# Patient Record
Sex: Male | Born: 1970 | Race: Black or African American | Hispanic: No | Marital: Single | Smoking: Current every day smoker
Health system: Southern US, Community
[De-identification: ages and names within clinical notes are randomized; demographics above are authoritative.]

## PROBLEM LIST (undated history)

## (undated) DIAGNOSIS — I10 Essential (primary) hypertension: Secondary | ICD-10-CM

---

## 2006-03-06 ENCOUNTER — Inpatient Hospital Stay (HOSPITAL_COMMUNITY): Admission: RE | Admit: 2006-03-06 | Discharge: 2006-03-11 | Payer: Self-pay | Admitting: Psychiatry

## 2006-03-06 ENCOUNTER — Ambulatory Visit: Payer: Self-pay | Admitting: Psychiatry

## 2006-07-11 ENCOUNTER — Emergency Department (HOSPITAL_COMMUNITY): Admission: EM | Admit: 2006-07-11 | Discharge: 2006-07-11 | Payer: Self-pay | Admitting: Emergency Medicine

## 2007-12-06 ENCOUNTER — Emergency Department (HOSPITAL_COMMUNITY): Admission: EM | Admit: 2007-12-06 | Discharge: 2007-12-06 | Payer: Self-pay | Admitting: Emergency Medicine

## 2007-12-08 ENCOUNTER — Emergency Department (HOSPITAL_COMMUNITY): Admission: EM | Admit: 2007-12-08 | Discharge: 2007-12-08 | Payer: Self-pay | Admitting: Emergency Medicine

## 2009-11-28 IMAGING — CT CT ABDOMEN W/O CM
1 of 2 series · 15 of 32 positions shown, 19 images · non-contrast
Comparison: Plain films same date

CT ABDOMEN

CLINICAL DATA: LEFT FLANK PAIN

CT ABDOMEN AND PELVIS WITHOUT CONTRAST (CT UROGRAM)
TECHNIQUE: Contiguous axial images of the abdomen and pelvis
without oral or intravenous contrast were obtained.

[Series 2: stone 5.0 b40f · axial · 0.66mm/px · z∈[-394,-30]mm · 15 of 81 slices shown, 19 images]
[im 4/81  soft-tissue]
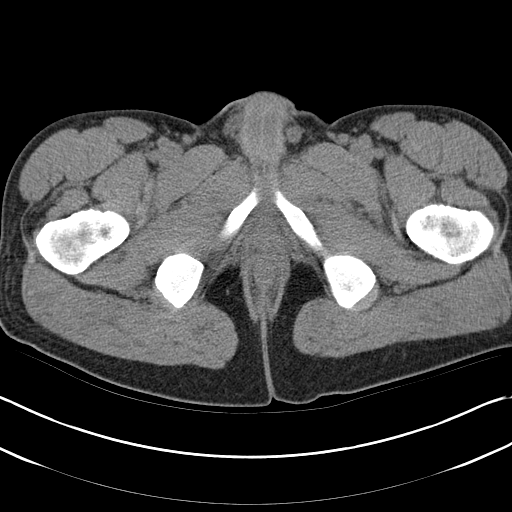
[im 4/81  bone]
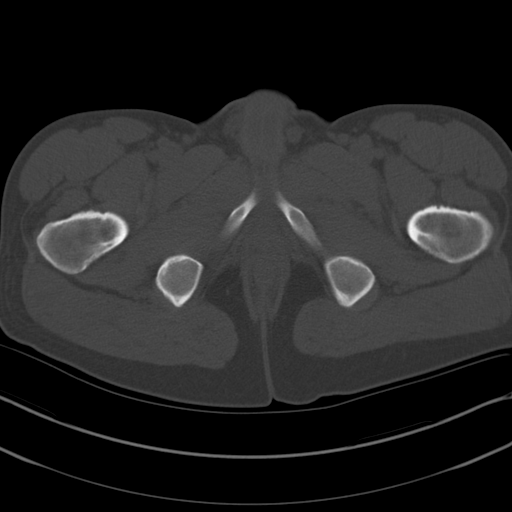
[im 10/81  soft-tissue]
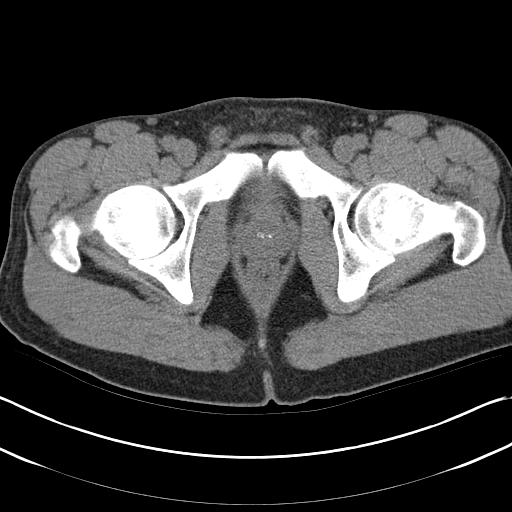
[im 16/81  soft-tissue]
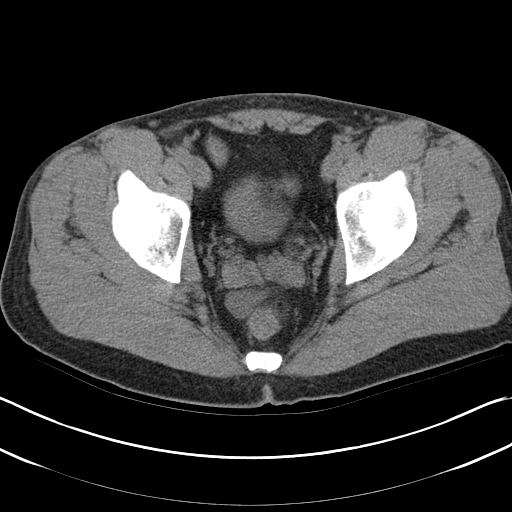
[im 22/81  soft-tissue]
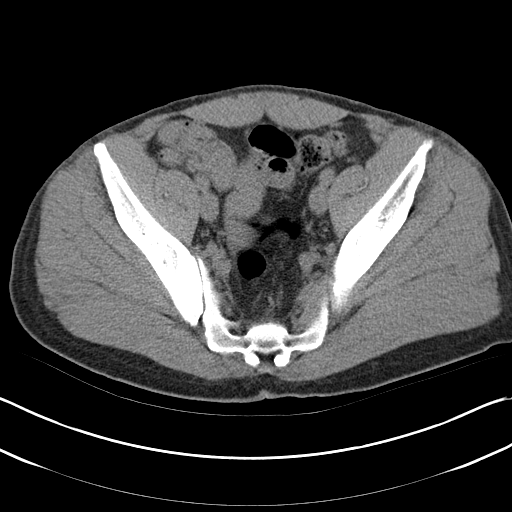
[im 28/81  soft-tissue]
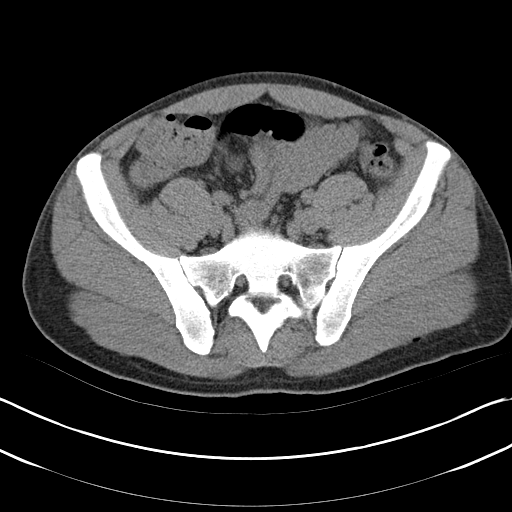
[im 34/81  soft-tissue]
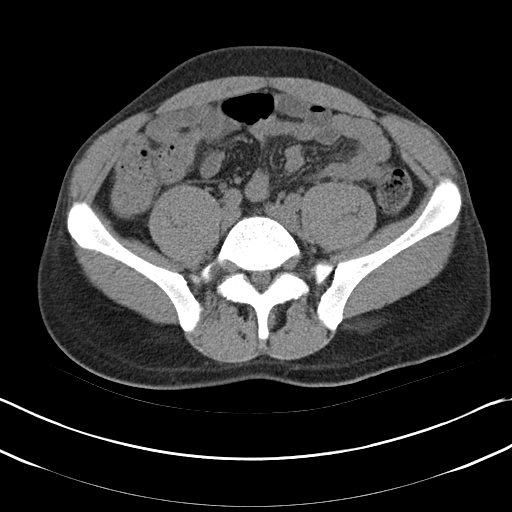
[im 41/81  soft-tissue]
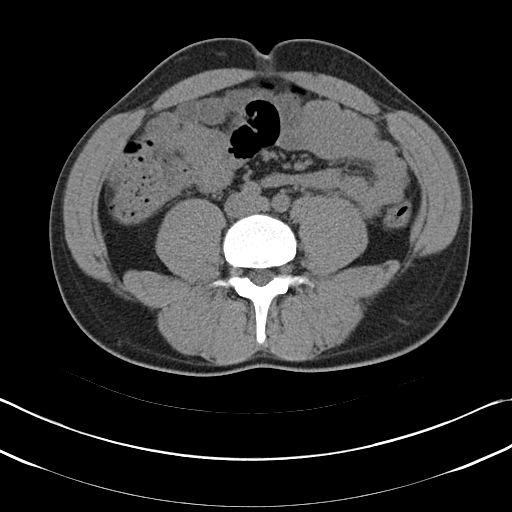
[im 47/81  soft-tissue]
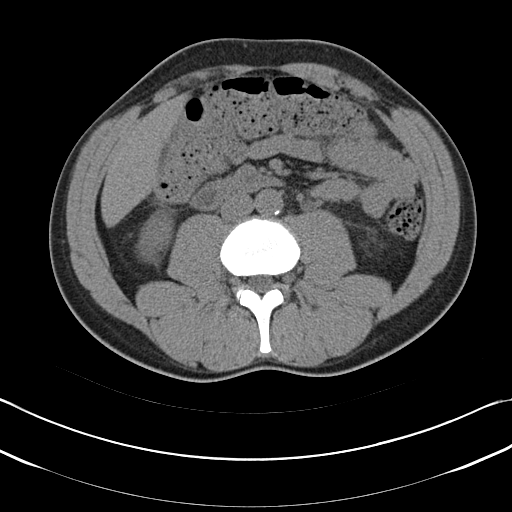
[im 53/81  soft-tissue]
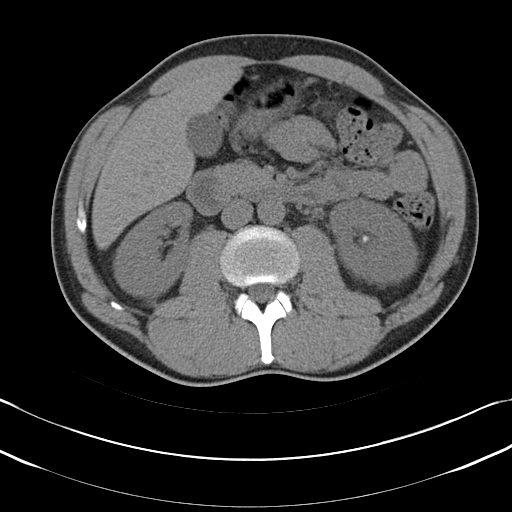
[im 53/81  bone]
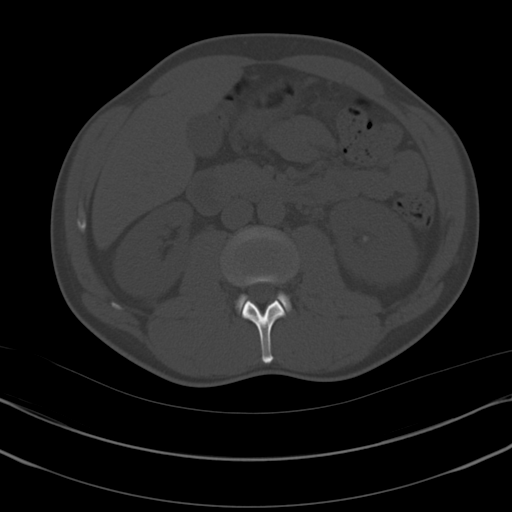
[im 59/81  soft-tissue]
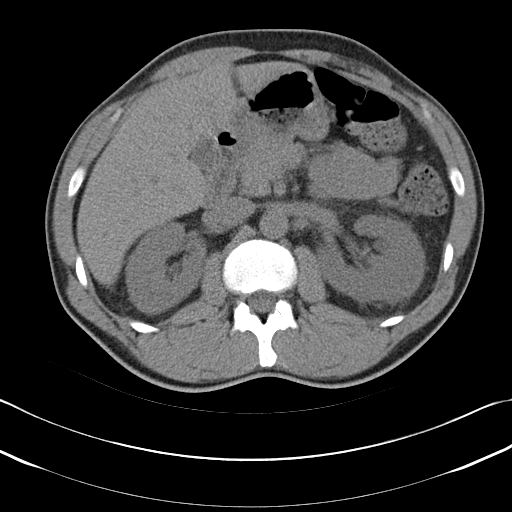
[im 65/81  soft-tissue]
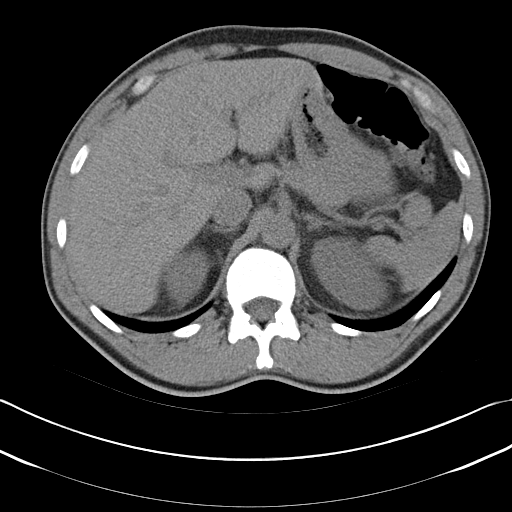
[im 68/81  lung]
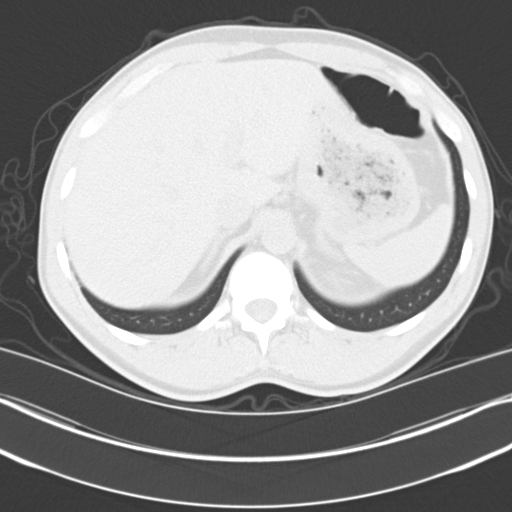
[im 71/81  soft-tissue]
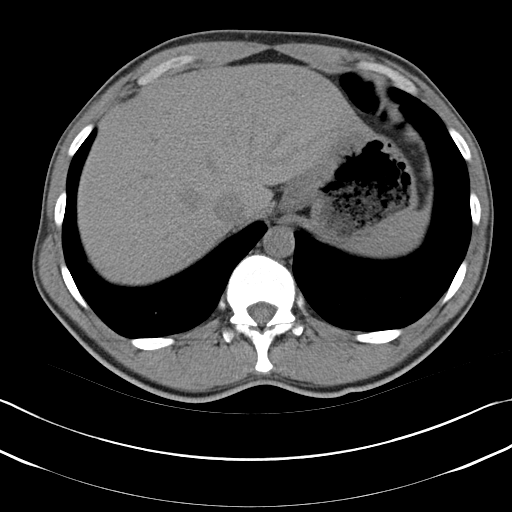
[im 71/81  lung]
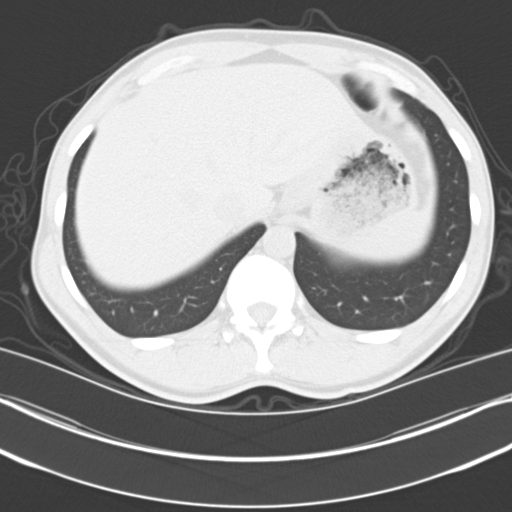
[im 74/81  lung]
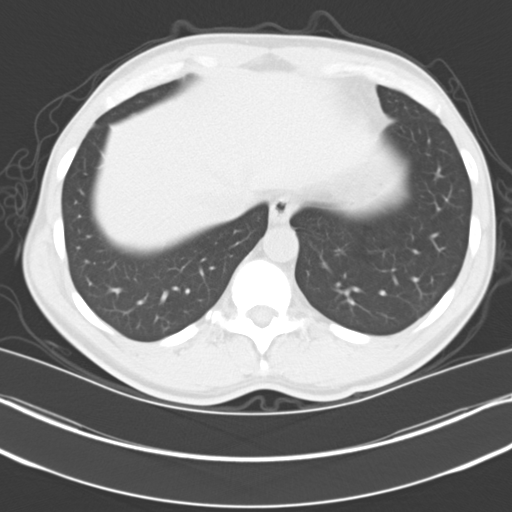
[im 77/81  soft-tissue]
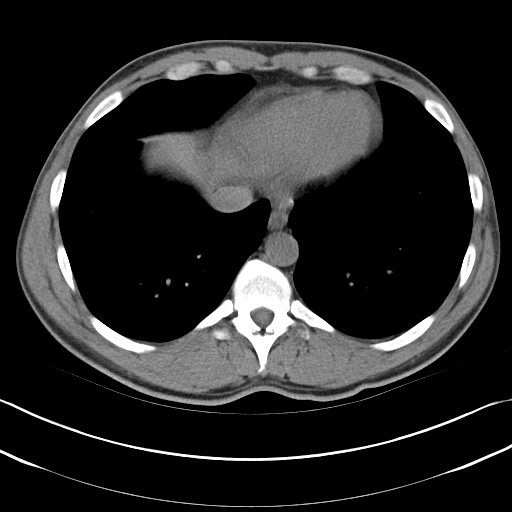
[im 77/81  lung]
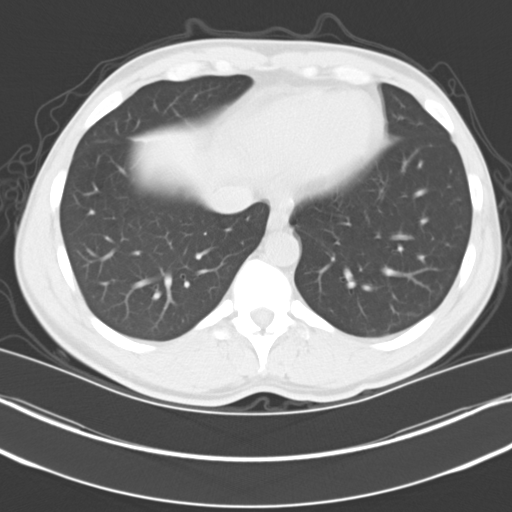

[15 of 32 positions shown; findings below may reference images not displayed]

FINDINGS: Exam is limited for evaluation of entities other than
urinary tract calculi due to lack of oral or intravenous contrast.

 Clear lung bases.  Normal heart size without pericardial or
pleural effusion.

Normal liver, spleen, stomach, pancreas, gallbladder, biliary tree,
adrenal glands.

No right renal calculi or hydronephrosis.

Moderate obstructive signs involve the left kidney.  Punctate left
lower pole renal calculus.  Proximal left ureteric calculus
measures 6 mm on axial image 30 and 6 mm on coronal image 33.  No
distal ureteric stone.

No retroperitoneal or retrocrural adenopathy.  Normal abdominal
bowel loops without ascites.
IMPRESSION: 1.  6 mm proximal left ureteric calculus with secondary obstructive
signs.
2.  Punctate left lower pole renal calculus.

CT PELVIS
FINDINGS: No distal urinary tract calculi.  Normal pelvic small
bowel.  No pelvic adenopathy.  Normal urinary bladder prostate.
Normal bones.
IMPRESSION: 1.  No acute pelvic process.

## 2009-12-30 ENCOUNTER — Emergency Department (HOSPITAL_COMMUNITY): Admission: EM | Admit: 2009-12-30 | Discharge: 2009-12-30 | Payer: Self-pay | Admitting: Emergency Medicine

## 2009-12-31 ENCOUNTER — Emergency Department (HOSPITAL_COMMUNITY): Admission: EM | Admit: 2009-12-31 | Discharge: 2009-12-31 | Payer: Self-pay | Admitting: Emergency Medicine

## 2010-12-21 NOTE — Discharge Summary (Signed)
[name], [name].:  [id number]   MEDICAL RECORD NO.:  [id number]          PATIENT TYPE:  IPS   LOCATION:  0303                          FACILITY:  [facility]   PHYSICIAN:  Anselm Jungling, MD  DATE OF BIRTH:  1971-01-06   DATE OF ADMISSION:  03/06/2006  DATE OF DISCHARGE:  03/11/2006                                 DISCHARGE SUMMARY   This is Dr. Electa Sniff with a psychiatric discharge note on August 9 the  patient is Melvin Hernandez dot admitted 08/02.   DATE OF DISCHARGE:  Seven record number [id number].   IDENTIFYING DATA AND REASON FOR ADMISSION:  The patient is a 40 year old  separated African-American male admitted due to depression, and complaints  of suicidal and homicidal ideation.  He had also been drinking alcohol to  passing out most nights.  He had no history of prior inpatient psychiatric  treatment and was not currently involved in any outpatient treatment.  Please refer to the admission note for further details pertaining to the  symptoms, circumstances, and history that led to his hospitalization.  He  was given initial Axis I diagnoses of depressive disorder NOS, and alcohol  dependence.   MEDICAL AND LABORATORY:  The patient was medically and physically assessed  by the psychiatric nurse practitioner.  He was in good health, without any  active or chronic medical problems.   HOSPITAL COURSE:  The patient was admitted to the adult inpatient  psychiatric service.  He was placed on a Librium protocol for alcohol  withdrawal.  He agreed to a trial of Lexapro 10 mg daily to address  depressive symptoms.  He participated in various therapeutic groups,  activities, and classes, and was a generally good participant.  He was calm,  cooperative, and polite with peers and staff alike throughout his stay.  He  indicated that he was open to the chemical dependency intensive outpatient  treatment program.   By the fourth hospital day, the patient indicated that  he was still having  occasional thoughts of suicide, and homicide towards the gentleman who had  become involved with his fiancee, from whom he was separated.  However, he  denied any actual plan or intent.  He reported that he was finding the  inpatient program helpful.  He was tolerating medication.   On the sixth hospital day, the patient was in much better spirits.  He  indicated that he was having no thoughts of self-harm or harm to others  whatsoever, and felt ready for discharge.   AFTERCARE:  The patient was discharged with a plan to continue on our  intensive outpatient program, to begin the day following his discharge.   DISCHARGE MEDICATIONS:  Lexapro 10 mg daily.   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  Alcohol dependence.   AXIS II:  Deferred.   AXIS III:  No acute or chronic illnesses.   AXIS IV:  Stressors severe.   AXIS V:  Global assessment of function on discharge 60.  Anselm Jungling, MD  Electronically Signed     SPB/MEDQ  D:  03/13/2006  T:  03/13/2006  Job:  [id number]

## 2013-04-18 ENCOUNTER — Encounter (HOSPITAL_COMMUNITY): Payer: Self-pay | Admitting: *Deleted

## 2013-04-18 ENCOUNTER — Emergency Department (HOSPITAL_COMMUNITY)
Admission: EM | Admit: 2013-04-18 | Discharge: 2013-04-18 | Disposition: A | Discharge status: Home / Self Care | Payer: Self-pay | Attending: Emergency Medicine | Admitting: Emergency Medicine

## 2013-04-18 DIAGNOSIS — I10 Essential (primary) hypertension: Secondary | ICD-10-CM | POA: Insufficient documentation

## 2013-04-18 DIAGNOSIS — R319 Hematuria, unspecified: Secondary | ICD-10-CM | POA: Insufficient documentation

## 2013-04-18 DIAGNOSIS — F17200 Nicotine dependence, unspecified, uncomplicated: Secondary | ICD-10-CM | POA: Insufficient documentation

## 2013-04-18 HISTORY — DX: Essential (primary) hypertension: I10

## 2013-04-18 LAB — URINE MICROSCOPIC-ADD ON

## 2013-04-18 LAB — CBC WITH DIFFERENTIAL/PLATELET
Basophils Absolute: 0.1 10*3/uL (ref 0.0–0.1)
Eosinophils Absolute: 0.3 10*3/uL (ref 0.0–0.7)
Eosinophils Relative: 2 % (ref 0–5)
HCT: 45.2 % (ref 39.0–52.0)
MCH: 27.9 pg (ref 26.0–34.0)
MCV: 81.9 fL (ref 78.0–100.0)
Platelets: 298 10*3/uL (ref 150–400)
RDW: 13.8 % (ref 11.5–15.5)

## 2013-04-18 LAB — URINALYSIS, ROUTINE W REFLEX MICROSCOPIC
Glucose, UA: NEGATIVE mg/dL
Ketones, ur: NEGATIVE mg/dL
Leukocytes, UA: NEGATIVE
Protein, ur: NEGATIVE mg/dL
Urobilinogen, UA: 0.2 mg/dL (ref 0.0–1.0)

## 2013-04-18 LAB — BASIC METABOLIC PANEL
CO2: 29 mEq/L (ref 19–32)
Chloride: 100 mEq/L (ref 96–112)
Creatinine, Ser: 1.22 mg/dL (ref 0.50–1.35)

## 2013-04-18 NOTE — ED Notes (Signed)
Pt reporting voiding bloody urine twice since midnight.  Denies any pain at present time.

## 2013-04-18 NOTE — ED Provider Notes (Signed)
CSN: [id number]     Arrival date & time 04/18/13  0502 History   First MD Initiated Contact with Patient 04/18/13 [phone]     Chief Complaint  Patient presents with  . Hematuria   (Consider location/radiation/quality/duration/timing/severity/associated sxs/prior Treatment) Patient is a 42 y.o. male presenting with hematuria. The history is provided by the patient.  Hematuria  He urinated twice during the night and both times there was blood in the urine. He states the urine looked dark brown. There is no associated abdominal pain or flank pain. There is no associated nausea or vomiting or fever or chills. He denies dysuria. He does have history of kidney stone in the past. He denies any trauma.  Past Medical History  Diagnosis Date  . Hypertension    History reviewed. No pertinent past surgical history. History reviewed. No pertinent family history. History  Substance Use Topics  . Smoking status: Current Every Day Smoker -- 1.00 packs/day    Types: Cigarettes  . Smokeless tobacco: Not on file  . Alcohol Use: No    Review of Systems  Genitourinary: Positive for hematuria.  All other systems reviewed and are negative.    Allergies  Review of patient's allergies indicates no known allergies.  Home Medications  No current outpatient prescriptions on file. BP 185/93  Pulse 73  Temp(Src) 98.4 F (36.9 C) (Oral)  Resp 20  Ht 5\' 9"  (1.753 m)  Wt 165 lb (74.844 kg)  BMI 24.36 kg/m2  SpO2 98% Physical Exam  Nursing note and vitals reviewed.  42 year old male, resting comfortably and in no acute distress. Vital signs are significant for hypertension with blood pressure 185/93. Oxygen saturation is 98%, which is normal. Head is normocephalic and atraumatic. PERRLA, EOMI. Oropharynx is clear. Neck is nontender and supple without adenopathy or JVD. Back is nontender and there is no CVA tenderness. Lungs are clear without rales, wheezes, or rhonchi. Chest is nontender. Heart  has regular rate and rhythm without murmur. Abdomen is soft, flat, nontender without masses or hepatosplenomegaly and peristalsis is normoactive. Extremities have no cyanosis or edema, full range of motion is present. Skin is warm and dry without rash. Neurologic: Mental status is normal, cranial nerves are intact, there are no motor or sensory deficits.  ED Course  Procedures (including critical care time) Labs Review Results for orders placed during the hospital encounter of 04/18/13  URINALYSIS, ROUTINE W REFLEX MICROSCOPIC      Result Value Range   Color, Urine YELLOW  YELLOW   APPearance CLEAR  CLEAR   Specific Gravity, Urine >1.030 (*) 1.005 - 1.030   pH 6.0  5.0 - 8.0   Glucose, UA NEGATIVE  NEGATIVE mg/dL   Hgb urine dipstick LARGE (*) NEGATIVE   Bilirubin Urine NEGATIVE  NEGATIVE   Ketones, ur NEGATIVE  NEGATIVE mg/dL   Protein, ur NEGATIVE  NEGATIVE mg/dL   Urobilinogen, UA 0.2  0.0 - 1.0 mg/dL   Nitrite NEGATIVE  NEGATIVE   Leukocytes, UA NEGATIVE  NEGATIVE  CBC WITH DIFFERENTIAL      Result Value Range   WBC 12.9 (*) 4.0 - 10.5 K/uL   RBC 5.52  4.22 - 5.81 MIL/uL   Hemoglobin 15.4  13.0 - 17.0 g/dL   HCT 47.8  29.5 - 62.1 %   MCV 81.9  78.0 - 100.0 fL   MCH 27.9  26.0 - 34.0 pg   MCHC 34.1  30.0 - 36.0 g/dL   RDW 30.8  65.7 - 84.6 %  Platelets 298  150 - 400 K/uL   Neutrophils Relative % 60  43 - 77 %   Neutro Abs 7.7  1.7 - 7.7 K/uL   Lymphocytes Relative 28  12 - 46 %   Lymphs Abs 3.6  0.7 - 4.0 K/uL   Monocytes Relative 10  3 - 12 %   Monocytes Absolute 1.3 (*) 0.1 - 1.0 K/uL   Eosinophils Relative 2  0 - 5 %   Eosinophils Absolute 0.3  0.0 - 0.7 K/uL   Basophils Relative 0  0 - 1 %   Basophils Absolute 0.1  0.0 - 0.1 K/uL  BASIC METABOLIC PANEL      Result Value Range   Sodium 137  135 - 145 mEq/L   Potassium 3.8  3.5 - 5.1 mEq/L   Chloride 100  96 - 112 mEq/L   CO2 29  19 - 32 mEq/L   Glucose, Bld 88  70 - 99 mg/dL   BUN 17  6 - 23 mg/dL    Creatinine, Ser 1.61  0.50 - 1.35 mg/dL   Calcium 09.6  8.4 - 04.5 mg/dL   GFR calc non Af Amer 72 (*) >90 mL/min   GFR calc Af Amer 84 (*) >90 mL/min  URINE MICROSCOPIC-ADD ON      Result Value Range   Squamous Epithelial / LPF RARE  RARE   WBC, UA 0-2  <3 WBC/hpf   RBC / HPF 21-50  <3 RBC/hpf   Bacteria, UA RARE  RARE    MDM   1. Hematuria    Painless hematuria. I reviewed his past records and he didn't have a kidney stone in 2009 and there was a residual calculus in the left kidney. However, I do not see an indication for CAT scan. Urinalysis will be obtained to see if he has any evidence of urinary tract infection and he will need referral to urology.  Urinalysis only shows microscopic hematuria and renal function is normal. At this point, no further need T. workup is indicated. He is referred to urology for further evaluation.  Dione Booze, MD 04/18/13 [phone]

## 2015-04-30 ENCOUNTER — Emergency Department (HOSPITAL_COMMUNITY)
Admission: EM | Admit: 2015-04-30 | Discharge: 2015-04-30 | Disposition: A | Discharge status: Home / Self Care | Payer: Self-pay | Attending: Emergency Medicine | Admitting: Emergency Medicine

## 2015-04-30 ENCOUNTER — Encounter (HOSPITAL_COMMUNITY): Payer: Self-pay | Admitting: *Deleted

## 2015-04-30 DIAGNOSIS — K088 Other specified disorders of teeth and supporting structures: Secondary | ICD-10-CM | POA: Insufficient documentation

## 2015-04-30 DIAGNOSIS — I10 Essential (primary) hypertension: Secondary | ICD-10-CM | POA: Insufficient documentation

## 2015-04-30 DIAGNOSIS — Z720 Tobacco use: Secondary | ICD-10-CM | POA: Insufficient documentation

## 2015-04-30 DIAGNOSIS — K0381 Cracked tooth: Secondary | ICD-10-CM | POA: Insufficient documentation

## 2015-04-30 DIAGNOSIS — K0889 Other specified disorders of teeth and supporting structures: Secondary | ICD-10-CM

## 2015-04-30 MED ORDER — HYDROCODONE-ACETAMINOPHEN 5-325 MG PO TABS: 2.0000 | ORAL_TABLET | ORAL | Status: DC | PRN

## 2015-04-30 MED ORDER — CLINDAMYCIN HCL 300 MG PO CAPS: 300.0000 mg | ORAL_CAPSULE | Freq: Four times a day (QID) | ORAL | Status: DC

## 2015-04-30 NOTE — ED Provider Notes (Signed)
CSN: [id number]     Arrival date & time 04/30/15  4098 History   First MD Initiated Contact with Patient 04/30/15 0900     Chief Complaint  Patient presents with  . Facial Swelling     (Consider location/radiation/quality/duration/timing/severity/associated sxs/prior Treatment) Patient is a 44 y.o. male presenting with tooth pain. The history is provided by the patient. No language interpreter was used.  Dental Pain Location:  Upper Upper teeth location:  1/RU 3rd molar Quality:  Aching Severity:  Moderate Onset quality:  Gradual Duration:  2 days Timing:  Constant Progression:  Worsening Chronicity:  New Context: dental caries and enamel fracture   Relieved by:  Nothing Worsened by:  Nothing tried Associated symptoms: gum swelling   Risk factors: periodontal disease   Pt has appointment with his dentist  Past Medical History  Diagnosis Date  . Hypertension    History reviewed. No pertinent past surgical history. No family history on file. Social History  Substance Use Topics  . Smoking status: Current Every Day Smoker -- 1.00 packs/day    Types: Cigarettes  . Smokeless tobacco: None  . Alcohol Use: No    Review of Systems  All other systems reviewed and are negative.     Allergies  Review of patient's allergies indicates no known allergies.  Home Medications   Prior to Admission medications   Medication Sig Start Date End Date Taking? Authorizing Provider  clindamycin (CLEOCIN) 300 MG capsule Take 1 capsule (300 mg total) by mouth every 6 (six) hours. 04/30/15   Elson Areas, PA-C  HYDROcodone-acetaminophen (NORCO/VICODIN) 5-325 MG per tablet Take 2 tablets by mouth every 4 (four) hours as needed. 04/30/15   Elson Areas, PA-C   BP 167/101 mmHg  Pulse 71  Temp(Src) 98.3 F (36.8 C) (Oral)  Resp 16  Ht  (1.778 m)  Wt 167 lb (75.751 kg)  BMI 23.96 kg/m2  SpO2 100% Physical Exam  Constitutional: He is oriented to person, place, and time. He  appears well-developed and well-nourished.  HENT:  Head: Normocephalic.  Broken tooth, swollen face  Eyes: Conjunctivae and EOM are normal. Pupils are equal, round, and reactive to light.  Neck: Normal range of motion.  Cardiovascular: Normal rate.   Pulmonary/Chest: Effort normal.  Abdominal: He exhibits no distension.  Musculoskeletal: Normal range of motion.  Neurological: He is alert and oriented to person, place, and time.  Skin: Skin is warm.  Psychiatric: He has a normal mood and affect.  Nursing note and vitals reviewed.   ED Course  Procedures (including critical care time) Labs Review Labs Reviewed - No data to display  Imaging Review No results found. I have personally reviewed and evaluated these images and lab results as part of my medical decision-making.   EKG Interpretation None      MDM   Final diagnoses:  Toothache    Meds ordered this encounter  Medications  . clindamycin (CLEOCIN) 300 MG capsule    Sig: Take 1 capsule (300 mg total) by mouth every 6 (six) hours.    Dispense:  40 capsule    Refill:  0  . HYDROcodone-acetaminophen (NORCO/VICODIN) 5-325 MG per tablet    Sig: Take 2 tablets by mouth every 4 (four) hours as needed.    Dispense:  10 tablet    Refill:  0     Lonia Skinner [place], PA-C 04/30/15 1191  Glynn Octave, MD 04/30/15 1520

## 2015-04-30 NOTE — Discharge Instructions (Signed)

## 2015-04-30 NOTE — ED Notes (Signed)
Pt states chipped tooth and noticed swelling on Friday which has gotten worse. Pt also states recent sinus infection as well

## 2015-06-09 ENCOUNTER — Other Ambulatory Visit: Payer: Self-pay | Admitting: Physician Assistant

## 2015-08-24 ENCOUNTER — Ambulatory Visit: Payer: Self-pay | Admitting: Physician Assistant

## 2015-08-24 ENCOUNTER — Encounter: Payer: Self-pay | Admitting: Physician Assistant

## 2015-08-24 VITALS — BP 154/96 | HR 66 | Temp 98.6°F | Ht 69.0 in | Wt 176.4 lb

## 2015-08-24 DIAGNOSIS — F17210 Nicotine dependence, cigarettes, uncomplicated: Secondary | ICD-10-CM | POA: Insufficient documentation

## 2015-08-24 DIAGNOSIS — I10 Essential (primary) hypertension: Secondary | ICD-10-CM | POA: Insufficient documentation

## 2015-08-24 DIAGNOSIS — Z13220 Encounter for screening for lipoid disorders: Secondary | ICD-10-CM

## 2015-08-24 DIAGNOSIS — K5900 Constipation, unspecified: Secondary | ICD-10-CM

## 2015-08-24 MED ORDER — LISINOPRIL 20 MG PO TABS: 40.0000 mg | ORAL_TABLET | Freq: Every day | ORAL | Status: DC

## 2015-08-24 NOTE — Progress Notes (Signed)
   BP 154/96 mmHg  Pulse 66  Temp(Src) 98.6 F (37 C)  Ht  (1.753 m)  Wt 176 lb 6.4 oz (80.015 kg)  BMI 26.04 kg/m2  SpO2 98%   Subjective:    Patient ID: Melvin Hernandez, male    DOB: 1971/05/15, 45 y.o.   MRN: [id number]  HPI: Melvin Hernandez is a 45 y.o. male presenting on 08/24/2015 for Hypertension   HPI   Pt is feeling well.  States occassional constipation is only concern.  Relevant past medical, surgical, family and social history reviewed and updated as indicated. Interim medical history since our last visit reviewed. Allergies and medications reviewed and updated.  Current outpatient prescriptions:  .  lisinopril (PRINIVIL,ZESTRIL) 20 MG tablet, TAKE ONE TABLET BY MOUTH ONCE DAILY FOR BLOOD PRESSURE, Disp: 30 tablet, Rfl: 2   Review of Systems  Constitutional: Negative for fever, chills, diaphoresis, appetite change, fatigue and unexpected weight change.  HENT: Negative for congestion, dental problem, drooling, ear pain, facial swelling, hearing loss, mouth sores, sneezing, sore throat, trouble swallowing and voice change.   Eyes: Negative for pain, discharge, redness, itching and visual disturbance.  Respiratory: Negative for cough, choking, shortness of breath and wheezing.   Cardiovascular: Negative for chest pain, palpitations and leg swelling.  Gastrointestinal: Positive for abdominal pain and constipation. Negative for vomiting, diarrhea and blood in stool.  Endocrine: Negative for cold intolerance, heat intolerance and polydipsia.  Genitourinary: Negative for dysuria, hematuria and decreased urine volume.  Musculoskeletal: Negative for back pain, arthralgias and gait problem.  Skin: Negative for rash.  Allergic/Immunologic: Negative for environmental allergies.  Neurological: Negative for seizures, syncope, light-headedness and headaches.  Hematological: Negative for adenopathy.  Psychiatric/Behavioral: Negative for suicidal ideas, dysphoric mood and  agitation. The patient is not nervous/anxious.     Per HPI unless specifically indicated above     Objective:    BP 154/96 mmHg  Pulse 66  Temp(Src) 98.6 F (37 C)  Ht  (1.753 m)  Wt 176 lb 6.4 oz (80.015 kg)  BMI 26.04 kg/m2  SpO2 98%  Wt Readings from Last 3 Encounters:  08/24/15 176 lb 6.4 oz (80.015 kg)  04/30/15 167 lb (75.751 kg)  04/18/13 165 lb (74.844 kg)    Physical Exam  Constitutional: He is oriented to person, place, and time. He appears well-developed and well-nourished.  HENT:  Head: Normocephalic and atraumatic.  Neck: Neck supple.  Cardiovascular: Normal rate and regular rhythm.   Pulmonary/Chest: Effort normal and breath sounds normal. He has no wheezes.  Abdominal: Soft. Bowel sounds are normal. He exhibits no distension. There is no hepatosplenomegaly. There is no tenderness.  Musculoskeletal: He exhibits no edema.  Lymphadenopathy:    He has no cervical adenopathy.  Neurological: He is alert and oriented to person, place, and time.  Skin: Skin is warm and dry.  Psychiatric: He has a normal mood and affect. His behavior is normal.  Vitals reviewed.       Assessment & Plan:   Encounter Diagnoses  Name Primary?  . Essential hypertension, benign Yes  . Constipation, unspecified constipation type   . Cigarette nicotine dependence, uncomplicated      Increase lisinopril Counseled on smoking cessation Counseled on constipation and gave reading information F/u 1 mo to recheck bp and constipation

## 2015-08-24 NOTE — Patient Instructions (Signed)
Constipation, Adult Constipation is when a person has fewer than three bowel movements a week, has difficulty having a bowel movement, or has stools that are dry, hard, or larger than normal. As people grow older, constipation is more common. A low-fiber diet, not taking in enough fluids, and taking certain medicines may make constipation worse.  CAUSES   Certain medicines, such as antidepressants, pain medicine, iron supplements, antacids, and water pills.   Certain diseases, such as diabetes, irritable bowel syndrome (IBS), thyroid disease, or depression.   Not drinking enough water.   Not eating enough fiber-rich foods.   Stress or travel.   Lack of physical activity or exercise.   Ignoring the urge to have a bowel movement.   Using laxatives too much.  SIGNS AND SYMPTOMS   Having fewer than three bowel movements a week.   Straining to have a bowel movement.   Having stools that are hard, dry, or larger than normal.   Feeling full or bloated.   Pain in the lower abdomen.   Not feeling relief after having a bowel movement.  TREATMENT  Treatment will depend on the severity of your constipation and what is causing it. Some dietary treatments include drinking more fluids and eating more fiber-rich foods. Lifestyle treatments may include regular exercise. If these diet and lifestyle recommendations do not help, your health care provider may recommend taking over-the-counter laxative medicines to help you have bowel movements. Prescription medicines may be prescribed if over-the-counter medicines do not work.  HOME CARE INSTRUCTIONS   Eat foods that have a lot of fiber, such as fruits, vegetables, whole grains, and beans.  Limit foods high in fat and processed sugars, such as french fries, hamburgers, cookies, candies, and soda.   A fiber supplement may be added to your diet if you cannot get enough fiber from foods.   Drink enough fluids to keep your urine  clear or pale yellow.   Exercise regularly or as directed by your health care provider.   Go to the restroom when you have the urge to go. Do not hold it.   Only take over-the-counter or prescription medicines as directed by your health care provider. Do not take other medicines for constipation without talking to your health care provider first.  SEEK IMMEDIATE MEDICAL CARE IF:   You have bright red blood in your stool.   Your constipation lasts for more than 4 days or gets worse.   You have abdominal or rectal pain.   You have thin, pencil-like stools.   You have unexplained weight loss. MAKE SURE YOU:   Understand these instructions.  Will watch your condition.  Will get help right away if you are not doing well or get worse.   This information is not intended to replace advice given to you by your health care provider. Make sure you discuss any questions you have with your health care provider.   Document Released: 04/19/2004 Document Revised: 08/12/2014 Document Reviewed: 05/03/2013 [employer] Interactive Patient Education 2016 [employer]  

## 2015-09-19 ENCOUNTER — Other Ambulatory Visit: Payer: Self-pay

## 2015-09-19 DIAGNOSIS — I10 Essential (primary) hypertension: Secondary | ICD-10-CM

## 2015-09-19 DIAGNOSIS — Z13220 Encounter for screening for lipoid disorders: Secondary | ICD-10-CM

## 2015-09-19 DIAGNOSIS — K5900 Constipation, unspecified: Secondary | ICD-10-CM

## 2015-09-23 LAB — CBC
HCT: 46.2 % (ref 39.0–52.0)
Hemoglobin: 15.3 g/dL (ref 13.0–17.0)
MCH: 27 pg (ref 26.0–34.0)
MCHC: 33.1 g/dL (ref 30.0–36.0)
MCV: 81.6 fL (ref 78.0–100.0)
MPV: 10.1 fL (ref 8.6–12.4)
PLATELETS: 264 10*3/uL (ref 150–400)
RBC: 5.66 MIL/uL (ref 4.22–5.81)
RDW: 13.7 % (ref 11.5–15.5)
WBC: 13.8 10*3/uL — ABNORMAL HIGH (ref 4.0–10.5)

## 2015-09-23 LAB — COMPLETE METABOLIC PANEL WITH GFR
ALBUMIN: 4.2 g/dL (ref 3.6–5.1)
ALK PHOS: 65 U/L (ref 40–115)
ALT: 15 U/L (ref 9–46)
AST: 15 U/L (ref 10–40)
BILIRUBIN TOTAL: 0.6 mg/dL (ref 0.2–1.2)
BUN: 16 mg/dL (ref 7–25)
CALCIUM: 9.5 mg/dL (ref 8.6–10.3)
CO2: 26 mmol/L (ref 20–31)
Chloride: 102 mmol/L (ref 98–110)
Creat: 1.34 mg/dL (ref 0.60–1.35)
GFR, EST NON AFRICAN AMERICAN: 64 mL/min (ref >=60)
GFR, Est African American: 74 mL/min (ref >=60)
GLUCOSE: 85 mg/dL (ref 65–99)
Potassium: 5 mmol/L (ref 3.5–5.3)
SODIUM: 137 mmol/L (ref 135–146)
Total Protein: 6.3 g/dL (ref 6.1–8.1)

## 2015-09-23 LAB — LIPID PANEL
CHOL/HDL RATIO: 2.4 ratio (ref <=5.0)
Cholesterol: 163 mg/dL (ref 125–200)
HDL: 68 mg/dL (ref >=40)
LDL Cholesterol: 83 mg/dL (ref <130)
Triglycerides: 58 mg/dL (ref <150)
VLDL: 12 mg/dL (ref <30)

## 2015-09-26 ENCOUNTER — Ambulatory Visit: Payer: Self-pay | Admitting: Physician Assistant

## 2015-09-27 ENCOUNTER — Encounter: Payer: Self-pay | Admitting: Physician Assistant

## 2015-10-31 ENCOUNTER — Ambulatory Visit: Payer: Self-pay | Admitting: Physician Assistant

## 2015-10-31 ENCOUNTER — Encounter: Payer: Self-pay | Admitting: Physician Assistant

## 2015-10-31 VITALS — BP 164/102 | HR 76 | Temp 97.9°F | Ht 69.0 in | Wt 176.1 lb

## 2015-10-31 DIAGNOSIS — Z91199 Patient's noncompliance with other medical treatment and regimen due to unspecified reason: Secondary | ICD-10-CM

## 2015-10-31 DIAGNOSIS — Z9119 Patient's noncompliance with other medical treatment and regimen: Secondary | ICD-10-CM

## 2015-10-31 DIAGNOSIS — F17210 Nicotine dependence, cigarettes, uncomplicated: Secondary | ICD-10-CM

## 2015-10-31 DIAGNOSIS — I10 Essential (primary) hypertension: Secondary | ICD-10-CM

## 2015-10-31 NOTE — Progress Notes (Signed)
BP 164/102 mmHg  Pulse 76  Temp(Src) 97.9 F (36.6 C)  Ht  (1.753 m)  Wt 176 lb 1.6 oz (79.878 kg)  BMI 25.99 kg/m2  SpO2 98%   Subjective:    Patient ID: Melvin Hernandez, male    DOB: 01/09/1971, 45 y.o.   MRN: [id number]  HPI: Melvin Hernandez is a 45 y.o. male presenting on 10/31/2015 for Hypertension   HPI   Pt has been out of his bp meds for several days.  Feels well.  Relevant past medical, surgical, family and social history reviewed and updated as indicated. Interim medical history since our last visit reviewed. Allergies and medications reviewed and updated.  CURRENT MEDS: None (supposed to be taking lisinopril  qd)  Review of Systems  Constitutional: Negative for fever, chills, diaphoresis, appetite change, fatigue and unexpected weight change.  HENT: Positive for sneezing. Negative for congestion, dental problem, drooling, ear pain, facial swelling, hearing loss, mouth sores, sore throat, trouble swallowing and voice change.   Eyes: Negative for pain, discharge, redness, itching and visual disturbance.  Respiratory: Negative for cough, choking, shortness of breath and wheezing.   Cardiovascular: Negative for chest pain, palpitations and leg swelling.  Gastrointestinal: Negative for vomiting, abdominal pain, diarrhea, constipation and blood in stool.  Endocrine: Negative for cold intolerance, heat intolerance and polydipsia.  Genitourinary: Negative for dysuria, hematuria and decreased urine volume.  Musculoskeletal: Negative for back pain, arthralgias and gait problem.  Skin: Negative for rash.  Allergic/Immunologic: Positive for environmental allergies.  Neurological: Negative for seizures, syncope, light-headedness and headaches.  Hematological: Negative for adenopathy.  Psychiatric/Behavioral: Negative for suicidal ideas, dysphoric mood and agitation. The patient is not nervous/anxious.     Per HPI unless specifically indicated above     Objective:    BP 164/102 mmHg  Pulse 76  Temp(Src) 97.9 F (36.6 C)  Ht  (1.753 m)  Wt 176 lb 1.6 oz (79.878 kg)  BMI 25.99 kg/m2  SpO2 98%  Wt Readings from Last 3 Encounters:  10/31/15 176 lb 1.6 oz (79.878 kg)  08/24/15 176 lb 6.4 oz (80.015 kg)  04/30/15 167 lb (75.751 kg)    Physical Exam  Constitutional: He is oriented to person, place, and time. He appears well-developed and well-nourished.  HENT:  Head: Normocephalic and atraumatic.  Neck: Neck supple.  Cardiovascular: Normal rate and regular rhythm.   Pulmonary/Chest: Effort normal and breath sounds normal. He has no wheezes.  Abdominal: Soft. Bowel sounds are normal. There is no hepatosplenomegaly. There is no tenderness.  Musculoskeletal: He exhibits no edema.  Lymphadenopathy:    He has no cervical adenopathy.  Neurological: He is alert and oriented to person, place, and time.  Skin: Skin is warm and dry.  Psychiatric: He has a normal mood and affect. His behavior is normal.  Vitals reviewed.   Results for orders placed or performed in visit on 09/19/15  Lipid Profile  Result Value Ref Range   Cholesterol 163 125 - 200 mg/dL   Triglycerides 58 <696 mg/dL   HDL 68 >=29 mg/dL   Total CHOL/HDL Ratio 2.4 <=5.0 Ratio   VLDL 12 <30 mg/dL   LDL Cholesterol 83 <528 mg/dL  COMPLETE METABOLIC PANEL WITH GFR  Result Value Ref Range   Sodium 137 135 - 146 mmol/L   Potassium 5.0 3.5 - 5.3 mmol/L   Chloride 102 98 - 110 mmol/L   CO2 26 20 - 31 mmol/L   Glucose, Bld 85 65 - 99  mg/dL   BUN 16 7 - 25 mg/dL   Creat 1.611.34 0.960.60 - 0.451.35 mg/dL   Total Bilirubin 0.6 0.2 - 1.2 mg/dL   Alkaline Phosphatase 65 40 - 115 U/L   AST 15 10 - 40 U/L   ALT 15 9 - 46 U/L   Total Protein 6.3 6.1 - 8.1 g/dL   Albumin 4.2 3.6 - 5.1 g/dL   Calcium 9.5 8.6 - 40.910.3 mg/dL   GFR, Est African American 74 >=60 mL/min   GFR, Est Non African American 64 >=60 mL/min  CBC  Result Value Ref Range   WBC 13.8 (H) 4.0 - 10.5 K/uL   RBC 5.66 4.22 - 5.81  MIL/uL   Hemoglobin 15.3 13.0 - 17.0 g/dL   HCT 81.146.2 91.439.0 - 78.252.0 %   MCV 81.6 78.0 - 100.0 fL   MCH 27.0 26.0 - 34.0 pg   MCHC 33.1 30.0 - 36.0 g/dL   RDW 95.613.7 21.311.5 - 08.615.5 %   Platelets 264 150 - 400 K/uL   MPV 10.1 8.6 - 12.4 fL      Assessment & Plan:   Encounter Diagnoses  Name Primary?  . Personal history of noncompliance with medical treatment, presenting hazards to health Yes  . Essential hypertension, benign   . Cigarette nicotine dependence, uncomplicated     -reviewed labs with pt -Pt counseled to avoid running out of his bp medication.   -pt counseled on smoking cessation -f/u 1 month to recheck bp

## 2015-11-30 ENCOUNTER — Ambulatory Visit: Payer: Self-pay | Admitting: Physician Assistant

## 2015-12-04 ENCOUNTER — Encounter: Payer: Self-pay | Admitting: Physician Assistant

## 2015-12-07 ENCOUNTER — Ambulatory Visit: Payer: Self-pay | Admitting: Physician Assistant

## 2015-12-07 ENCOUNTER — Encounter: Payer: Self-pay | Admitting: Physician Assistant

## 2015-12-07 VITALS — BP 154/92 | HR 79 | Temp 98.1°F | Ht 69.0 in | Wt 172.9 lb

## 2015-12-07 DIAGNOSIS — I10 Essential (primary) hypertension: Secondary | ICD-10-CM

## 2015-12-07 MED ORDER — METOPROLOL TARTRATE 50 MG PO TABS: 50.0000 mg | ORAL_TABLET | Freq: Two times a day (BID) | ORAL | Status: DC

## 2015-12-07 NOTE — Progress Notes (Signed)
   BP 154/92 mmHg  Pulse 79  Temp(Src) 98.1 F (36.7 C)  Ht 5\' 9"  (1.753 m)  Wt 172 lb 14.4 oz (78.427 kg)  BMI 25.52 kg/m2  SpO2 96%   Subjective:    Patient ID: Melvin Hernandez, male    DOB: [birth year], 45 y.o.   MRN: [id number]  HPI: Melvin Hernandez is a 45 y.o. male presenting on 12/07/2015 for Hypertension   HPI Pt no-showed to his appointment last week.   Relevant past medical, surgical, family and social history reviewed and updated as indicated. Interim medical history since our last visit reviewed. Allergies and medications reviewed and updated.  Current outpatient prescriptions:  .  lisinopril (PRINIVIL,ZESTRIL) 20 MG tablet, Take 2 tablets (40 mg total) by mouth daily., Disp: 60 tablet, Rfl: 3   Review of Systems  Constitutional: Positive for appetite change. Negative for fever, chills, diaphoresis, fatigue and unexpected weight change.  HENT: Negative for congestion, dental problem, drooling, ear pain, facial swelling, hearing loss, mouth sores, sneezing, sore throat, trouble swallowing and voice change.   Eyes: Negative for pain, discharge, redness, itching and visual disturbance.  Respiratory: Negative for cough, choking, shortness of breath and wheezing.   Cardiovascular: Negative for chest pain, palpitations and leg swelling.  Gastrointestinal: Negative for vomiting, abdominal pain, diarrhea, constipation and blood in stool.  Endocrine: Negative for cold intolerance, heat intolerance and polydipsia.  Genitourinary: Negative for dysuria, hematuria and decreased urine volume.  Musculoskeletal: Negative for back pain, arthralgias and gait problem.  Skin: Negative for rash.  Allergic/Immunologic: Negative for environmental allergies.  Neurological: Negative for seizures, syncope, light-headedness and headaches.  Hematological: Negative for adenopathy.  Psychiatric/Behavioral: Negative for suicidal ideas, dysphoric mood and agitation. The patient is not nervous/anxious.      Per HPI unless specifically indicated above     Objective:    BP 154/92 mmHg  Pulse 79  Temp(Src) 98.1 F (36.7 C)  Ht 5\' 9"  (1.753 m)  Wt 172 lb 14.4 oz (78.427 kg)  BMI 25.52 kg/m2  SpO2 96%  Wt Readings from Last 3 Encounters:  12/07/15 172 lb 14.4 oz (78.427 kg)  10/31/15 176 lb 1.6 oz (79.878 kg)  08/24/15 176 lb 6.4 oz (80.015 kg)    Physical Exam  Constitutional: He is oriented to person, place, and time. He appears well-developed and well-nourished.  HENT:  Head: Normocephalic and atraumatic.  Neck: Neck supple.  Cardiovascular: Normal rate and regular rhythm.   Pulmonary/Chest: Effort normal and breath sounds normal. He has no wheezes.  Abdominal: Soft. Bowel sounds are normal. There is no hepatosplenomegaly. There is no tenderness.  Musculoskeletal: He exhibits no edema.  Lymphadenopathy:    He has no cervical adenopathy.  Neurological: He is alert and oriented to person, place, and time.  Skin: Skin is warm and dry.  Psychiatric: He has a normal mood and affect. His behavior is normal.  Vitals reviewed.     Assessment & Plan:   Encounter Diagnosis  Name Primary?  . Essential hypertension, benign Yes    -Add metoprolol -F/u 1 month to recheck bp

## 2016-01-08 ENCOUNTER — Ambulatory Visit: Payer: Self-pay | Admitting: Physician Assistant

## 2016-01-08 ENCOUNTER — Encounter: Payer: Self-pay | Admitting: Physician Assistant

## 2016-01-08 VITALS — BP 154/86 | HR 57 | Temp 98.1°F | Ht 69.0 in | Wt 177.4 lb

## 2016-01-08 DIAGNOSIS — I10 Essential (primary) hypertension: Secondary | ICD-10-CM

## 2016-01-08 MED ORDER — METOPROLOL TARTRATE 100 MG PO TABS: 100.0000 mg | ORAL_TABLET | Freq: Two times a day (BID) | ORAL | Status: DC

## 2016-01-08 MED ORDER — LISINOPRIL 20 MG PO TABS: 20.0000 mg | ORAL_TABLET | Freq: Two times a day (BID) | ORAL | Status: DC

## 2016-01-08 NOTE — Progress Notes (Signed)
   BP 154/86 mmHg  Pulse 57  Temp(Src) 98.1 F (36.7 C)  Ht 5\' 9"  (1.753 m)  Wt 177 lb 6.4 oz (80.468 kg)  BMI 26.19 kg/m2  SpO2 97%   Subjective:    Patient ID: Melvin Hernandez, male    DOB: 02-16-1971, 45 y.o.   MRN: [id number]  HPI: Melvin CantorSteven L Ruvalcaba is a 45 y.o. male presenting on 01/08/2016 for Hypertension   HPI   Pt has been taking his metoprolol 2 qd instead of as prescribed 1 bid.  Relevant past medical, surgical, family and social history reviewed and updated as indicated. Interim medical history since our last visit reviewed. Allergies and medications reviewed and updated.   Current outpatient prescriptions:  .  lisinopril (PRINIVIL,ZESTRIL) 20 MG tablet, Take 2 tablets (40 mg total) by mouth daily., Disp: 60 tablet, Rfl: 3 .  metoprolol (LOPRESSOR) 50 MG tablet, Take 1 tablet (50 mg total) by mouth 2 (two) times daily., Disp: 60 tablet, Rfl: 0  Review of Systems  Constitutional: Negative for fever, chills, diaphoresis, appetite change, fatigue and unexpected weight change.  HENT: Negative for congestion, dental problem, drooling, ear pain, facial swelling, hearing loss, mouth sores, sneezing, sore throat, trouble swallowing and voice change.   Eyes: Negative for pain, discharge, redness, itching and visual disturbance.  Respiratory: Negative for cough, choking, shortness of breath and wheezing.   Cardiovascular: Negative for chest pain, palpitations and leg swelling.  Gastrointestinal: Negative for vomiting, abdominal pain, diarrhea, constipation and blood in stool.  Endocrine: Negative for cold intolerance, heat intolerance and polydipsia.  Genitourinary: Negative for dysuria, hematuria and decreased urine volume.  Musculoskeletal: Negative for back pain, arthralgias and gait problem.  Skin: Negative for rash.  Allergic/Immunologic: Negative for environmental allergies.  Neurological: Negative for seizures, syncope, light-headedness and headaches.  Hematological:  Negative for adenopathy.  Psychiatric/Behavioral: Negative for suicidal ideas, dysphoric mood and agitation. The patient is not nervous/anxious.     Per HPI unless specifically indicated above     Objective:    BP 154/86 mmHg  Pulse 57  Temp(Src) 98.1 F (36.7 C)  Ht 5\' 9"  (1.753 m)  Wt 177 lb 6.4 oz (80.468 kg)  BMI 26.19 kg/m2  SpO2 97%  Wt Readings from Last 3 Encounters:  01/08/16 177 lb 6.4 oz (80.468 kg)  12/07/15 172 lb 14.4 oz (78.427 kg)  10/31/15 176 lb 1.6 oz (79.878 kg)    Physical Exam  Constitutional: He is oriented to person, place, and time. He appears well-developed and well-nourished.  HENT:  Head: Normocephalic and atraumatic.  Neck: Neck supple.  Cardiovascular: Normal rate and regular rhythm.   Pulmonary/Chest: Effort normal and breath sounds normal. He has no wheezes.  Abdominal: Soft. Bowel sounds are normal. There is no hepatosplenomegaly. There is no tenderness.  Musculoskeletal: He exhibits no edema.  Lymphadenopathy:    He has no cervical adenopathy.  Neurological: He is alert and oriented to person, place, and time.  Skin: Skin is warm and dry.  Psychiatric: He has a normal mood and affect. His behavior is normal.  Vitals reviewed.       Assessment & Plan:   Encounter Diagnosis  Name Primary?  . Essential hypertension, benign Yes    -Discussed taking meds as prescribed- metoprolol is not extended release -increase metoprolol.   -f/u 1 month to recheck BP

## 2016-02-05 ENCOUNTER — Ambulatory Visit: Payer: Self-pay | Admitting: Physician Assistant

## 2016-02-05 ENCOUNTER — Encounter: Payer: Self-pay | Admitting: Physician Assistant

## 2016-02-05 VITALS — BP 170/90 | HR 61 | Temp 97.9°F | Ht 69.0 in | Wt 176.0 lb

## 2016-02-05 DIAGNOSIS — I10 Essential (primary) hypertension: Secondary | ICD-10-CM

## 2016-02-05 DIAGNOSIS — F17210 Nicotine dependence, cigarettes, uncomplicated: Secondary | ICD-10-CM

## 2016-02-05 DIAGNOSIS — R079 Chest pain, unspecified: Secondary | ICD-10-CM

## 2016-02-05 MED ORDER — AMLODIPINE BESYLATE 5 MG PO TABS: 5.0000 mg | ORAL_TABLET | Freq: Every day | ORAL | Status: DC

## 2016-02-05 MED ORDER — NITROGLYCERIN 0.4 MG SL SUBL: 0.4000 mg | SUBLINGUAL_TABLET | SUBLINGUAL | Status: AC | PRN

## 2016-02-05 NOTE — Patient Instructions (Addendum)
Get blood/labs drawn this a.m. New rx- nitroglycerin and amlodipine CONTINUE metoprolol and lisinopril Turn in your [facility] discount application

## 2016-02-05 NOTE — Progress Notes (Signed)
BP 184/94 mmHg  Pulse 61  Temp(Src) 97.9 F (36.6 C)  Ht 5\' 9"  (1.753 m)  Wt 176 lb (79.833 kg)  BMI 25.98 kg/m2  SpO2 97%   Subjective:    Patient ID: Melvin CantorSteven L Bohr, male    DOB: Sep 13, 1970, 45 y.o.   MRN: [id number]  HPI: Melvin Hernandez is a 45 y.o. male presenting on 02/05/2016 for Hypertension and Tingling   HPI   Chief Complaint  Patient presents with  . Hypertension    pt states he hasn't taken his medicines yet this morning.  . Tingling    pt states he gets a tingling sensation on his L arm and L sided chest. pt states it occurs about twice a day lasting 1-2 minutes.    pt feeling fine right now.    Pt states chesst pain is usually every other day, usually in the morning but sometimes at night. No associated sob or nausea.  He says pain Radiates down L arm  Relevant past medical, surgical, family and social history reviewed and updated as indicated. Interim medical history since our last visit reviewed. Allergies and medications reviewed and updated.   Current outpatient prescriptions:  .  lisinopril (PRINIVIL,ZESTRIL) 20 MG tablet, Take 1 tablet (20 mg total) by mouth 2 (two) times daily., Disp: 60 tablet, Rfl: 2 .  metoprolol (LOPRESSOR) 100 MG tablet, Take 1 tablet (100 mg total) by mouth 2 (two) times daily., Disp: 60 tablet, Rfl: 3   Review of Systems  Constitutional: Negative for fever, chills, diaphoresis, appetite change, fatigue and unexpected weight change.  HENT: Negative for congestion, dental problem, drooling, ear pain, facial swelling, hearing loss, mouth sores, sneezing, sore throat, trouble swallowing and voice change.   Eyes: Negative for pain, discharge, redness, itching and visual disturbance.  Respiratory: Negative for cough, choking, shortness of breath and wheezing.   Cardiovascular: Negative for chest pain, palpitations and leg swelling.  Gastrointestinal: Negative for vomiting, abdominal pain, diarrhea, constipation and blood in stool.   Endocrine: Negative for cold intolerance, heat intolerance and polydipsia.  Genitourinary: Negative for dysuria, hematuria and decreased urine volume.  Musculoskeletal: Negative for back pain, arthralgias and gait problem.  Skin: Negative for rash.  Allergic/Immunologic: Negative for environmental allergies.  Neurological: Negative for seizures, syncope, light-headedness and headaches.  Hematological: Negative for adenopathy.  Psychiatric/Behavioral: Negative for suicidal ideas, dysphoric mood and agitation. The patient is not nervous/anxious.     Per HPI unless specifically indicated above     Objective:    BP 184/94 mmHg  Pulse 61  Temp(Src) 97.9 F (36.6 C)  Ht 5\' 9"  (1.753 m)  Wt 176 lb (79.833 kg)  BMI 25.98 kg/m2  SpO2 97%  Wt Readings from Last 3 Encounters:  02/05/16 176 lb (79.833 kg)  01/08/16 177 lb 6.4 oz (80.468 kg)  12/07/15 172 lb 14.4 oz (78.427 kg)    Physical Exam  Constitutional: He is oriented to person, place, and time. He appears well-developed and well-nourished.  HENT:  Head: Normocephalic and atraumatic.  Neck: Neck supple.  Cardiovascular: Normal rate and regular rhythm.   Pulmonary/Chest: Effort normal and breath sounds normal. He has no wheezes.  Abdominal: Soft. Bowel sounds are normal. There is no hepatosplenomegaly. There is no tenderness.  Musculoskeletal: He exhibits no edema.  Lymphadenopathy:    He has no cervical adenopathy.  Neurological: He is alert and oriented to person, place, and time.  Skin: Skin is warm and dry.  Psychiatric: He has a normal  mood and affect. His behavior is normal.  Vitals reviewed.  EKG- sinus bradycardia with no st-t changes.     Assessment & Plan:    Encounter Diagnoses  Name Primary?  . Essential hypertension, benign Yes  . Cigarette nicotine dependence, uncomplicated   . Chest pain, unspecified chest pain type      -Gave NTG rx and counseled pt on its use. Pt told to go to ER if CP persists  after 3 tabs -Gave [employer] discount application -Refer to cardiology -Add norvasc 5 mg -check bmp and cbc today when leaves office -F/u 1 month. RTO sooner prn

## 2016-02-28 ENCOUNTER — Encounter (INDEPENDENT_AMBULATORY_CARE_PROVIDER_SITE_OTHER): Payer: Self-pay

## 2016-02-28 ENCOUNTER — Ambulatory Visit (INDEPENDENT_AMBULATORY_CARE_PROVIDER_SITE_OTHER): Payer: Self-pay | Admitting: Cardiology

## 2016-02-28 ENCOUNTER — Encounter: Payer: Self-pay | Admitting: Cardiology

## 2016-02-28 VITALS — BP 162/100 | HR 58 | Ht 70.0 in | Wt 172.0 lb

## 2016-02-28 DIAGNOSIS — I10 Essential (primary) hypertension: Secondary | ICD-10-CM

## 2016-02-28 DIAGNOSIS — R079 Chest pain, unspecified: Secondary | ICD-10-CM

## 2016-02-28 MED ORDER — CHLORTHALIDONE 25 MG PO TABS: 12.5000 mg | ORAL_TABLET | Freq: Every day | ORAL | 3 refills | Status: DC

## 2016-02-28 NOTE — Patient Instructions (Signed)
Medication Instructions:  HOLD METOPROLOL FOR NOW  START CHLORTHALIDONE 12.5 MG DAILY   Labwork: NONE  Testing/Procedures: Your physician has requested that you have an exercise tolerance test. For further information please visit [email or web address] Please also follow instruction sheet, as given.    Follow-Up: Your physician recommends that you schedule a follow-up appointment in: 1 MONTH    Any Other Special Instructions Will Be Listed Below (If Applicable).     If you need a refill on your cardiac medications before your next appointment, please call your pharmacy.

## 2016-02-28 NOTE — Progress Notes (Signed)
Clinical Summary Melvin Hernandez is a 45 y.o.male seen today as a new patient. He is referred by PA McElroy.   1. Chest pain - started about 1 month ago. Funny feeling in left chest down left arm. Can occur at rest or with activity. Can have some nasuea, lightheadness. Lasts just a few seconds. Occurs daily, multiple times. Not positional.  - low energy. No sob or DOE - no LE edema, no orthopnea, no pnd CAD risk factors: HTN, +tobacco x 26 years  2. HTN - recently started on norvasc 5mg  daily by pcp - compliant with meds, but has not taken yet today. - 12/2015 started on lopressor. Since that time has had some dizziness.   Past Medical History:  Diagnosis Date  . Hypertension      No Known Allergies   Current Outpatient Prescriptions  Medication Sig Dispense Refill  . amLODipine (NORVASC) 5 MG tablet Take 1 tablet (5 mg total) by mouth daily. 30 tablet 3  . lisinopril (PRINIVIL,ZESTRIL) 20 MG tablet Take 1 tablet (20 mg total) by mouth 2 (two) times daily. 60 tablet 2  . metoprolol (LOPRESSOR) 100 MG tablet Take 1 tablet (100 mg total) by mouth 2 (two) times daily. 60 tablet 3  . nitroGLYCERIN (NITROSTAT) 0.4 MG SL tablet Place 1 tablet (0.4 mg total) under the tongue every 5 (five) minutes as needed for chest pain. 25 tablet 3   No current facility-administered medications for this visit.      No past surgical history on file.   No Known Allergies    Family History  Problem Relation Age of Onset  . Hypertension Mother      Social History Melvin Hernandez reports that he has been smoking Cigarettes.  He has a 13.50 pack-year smoking history. He has never used smokeless tobacco. Melvin Hernandez reports that he drinks alcohol.   Review of Systems CONSTITUTIONAL: No weight loss, fever, chills, weakness or fatigue.  HEENT: Eyes: No visual loss, blurred vision, double vision or yellow sclerae.No hearing loss, sneezing, congestion, runny nose or sore throat.  SKIN: No rash or  itching.  CARDIOVASCULAR: per HPI RESPIRATORY: No shortness of breath, cough or sputum.  GASTROINTESTINAL: No anorexia, nausea, vomiting or diarrhea. No abdominal pain or blood.  GENITOURINARY: No burning on urination, no polyuria NEUROLOGICAL:+dizziness MUSCULOSKELETAL: No muscle, back pain, joint pain or stiffness.  LYMPHATICS: No enlarged nodes. No history of splenectomy.  PSYCHIATRIC: No history of depression or anxiety.  ENDOCRINOLOGIC: No reports of sweating, cold or heat intolerance. No polyuria or polydipsia.  Marland Kitchen   Physical Examination Vitals:   02/28/16 0834  BP: (!) 162/100  Pulse: (!) 58   Vitals:   02/28/16 0834  Weight: 172 lb (78 kg)  Height: 5\' 10"  (1.778 m)    Gen: resting comfortably, no acute distress HEENT: no scleral icterus, pupils equal round and reactive, no palptable cervical adenopathy,  CV: RRR, no mr/g, no jvd Resp: Clear to auscultation bilaterally GI: abdomen is soft, non-tender, non-distended, normal bowel sounds, no hepatosplenomegaly MSK: extremities are warm, no edema.  Skin: warm, no rash Neuro:  no focal deficits Psych: appropriate affect     Assessment and Plan  1. Chest pain - unclear etiology. EKG from pcp evaluated, NSR without ischemic changes we will plan for GXT to further evalute  2. HTN - dizziness since starting lopressor. He does have borderline heart rates - he will hold lopressor at this time, we will start chlorhtalidone 12.5mg  daily. He already  has f/u labs arranged with his pcp.   F/u 1 month    Antoine Poche, M.D.

## 2016-03-05 ENCOUNTER — Ambulatory Visit: Payer: Self-pay | Admitting: Physician Assistant

## 2016-03-06 ENCOUNTER — Ambulatory Visit (HOSPITAL_COMMUNITY)
Admission: RE | Admit: 2016-03-06 | Discharge: 2016-03-06 | Disposition: A | Discharge status: Home / Self Care | Payer: Self-pay | Source: Ambulatory Visit | Attending: Cardiology | Admitting: Cardiology

## 2016-03-06 DIAGNOSIS — R079 Chest pain, unspecified: Secondary | ICD-10-CM | POA: Insufficient documentation

## 2016-03-06 LAB — EXERCISE TOLERANCE TEST
CHL RATE OF PERCEIVED EXERTION: 15
CSEPED: 10 min
CSEPHR: 102 %
Estimated workload: 13.4 METS
Exercise duration (sec): 33 s
MPHR: 177 {beats}/min
Peak HR: 181 {beats}/min
Rest HR: 95 {beats}/min

## 2016-03-11 ENCOUNTER — Encounter: Payer: Self-pay | Admitting: Physician Assistant

## 2016-03-11 ENCOUNTER — Other Ambulatory Visit: Payer: Self-pay | Admitting: Physician Assistant

## 2016-03-11 ENCOUNTER — Ambulatory Visit: Payer: Self-pay | Admitting: Physician Assistant

## 2016-03-11 VITALS — BP 122/86 | HR 68 | Temp 97.7°F | Ht 70.0 in | Wt 172.0 lb

## 2016-03-11 DIAGNOSIS — I10 Essential (primary) hypertension: Secondary | ICD-10-CM

## 2016-03-11 DIAGNOSIS — F17210 Nicotine dependence, cigarettes, uncomplicated: Secondary | ICD-10-CM

## 2016-03-11 LAB — CBC
HCT: 47.2 % (ref 38.5–50.0)
HEMOGLOBIN: 15.7 g/dL (ref 13.2–17.1)
MCH: 27 pg (ref 27.0–33.0)
MCHC: 33.3 g/dL (ref 32.0–36.0)
MCV: 81.1 fL (ref 80.0–100.0)
MPV: 9.9 fL (ref 7.5–12.5)
Platelets: 302 10*3/uL (ref 140–400)
RBC: 5.82 MIL/uL — AB (ref 4.20–5.80)
RDW: 13.1 % (ref 11.0–15.0)
WBC: 13.8 10*3/uL — ABNORMAL HIGH (ref 3.8–10.8)

## 2016-03-11 LAB — BASIC METABOLIC PANEL
BUN: 19 mg/dL (ref 7–25)
CHLORIDE: 98 mmol/L (ref 98–110)
CO2: 27 mmol/L (ref 20–31)
Calcium: 10.1 mg/dL (ref 8.6–10.3)
Creat: 1.17 mg/dL (ref 0.60–1.35)
GLUCOSE: 100 mg/dL — AB (ref 65–99)
POTASSIUM: 4.3 mmol/L (ref 3.5–5.3)
SODIUM: 138 mmol/L (ref 135–146)

## 2016-03-11 NOTE — Progress Notes (Signed)
BP 122/86 (BP Location: Left Arm, Patient Position: Sitting, Cuff Size: Normal)   Pulse 68   Temp 97.7 F (36.5 C) (Other (Comment))   Ht  (1.778 m)   Wt 172 lb (78 kg)   SpO2 98%   BMI 24.68 kg/m    Subjective:    Patient ID: Melvin Hernandez, male    DOB: 1970-09-27, 45 y.o.   MRN: [id number]  HPI: Melvin Hernandez is a 45 y.o. male presenting on 03/11/2016 for Hypertension   HPI   Pt says he thinks his dizziness is improving since stopping the metoprolol.  He had 2 episodes last week but thinks it might have been because he hadn't eaten.  Pt got blood drawn this morning before appointment.  Relevant past medical, surgical, family and social history reviewed and updated as indicated. Interim medical history since our last visit reviewed. Allergies and medications reviewed and updated.   Current Outpatient Prescriptions:  .  amLODipine (NORVASC) 5 MG tablet, Take 1 tablet (5 mg total) by mouth daily., Disp: 30 tablet, Rfl: 3 .  chlorthalidone (HYGROTON) 25 MG tablet, Take 0.5 tablets (12.5 mg total) by mouth daily., Disp: 30 tablet, Rfl: 3 .  lisinopril (PRINIVIL,ZESTRIL) 20 MG tablet, Take 1 tablet (20 mg total) by mouth 2 (two) times daily., Disp: 60 tablet, Rfl: 2 .  nitroGLYCERIN (NITROSTAT) 0.4 MG SL tablet, Place 1 tablet (0.4 mg total) under the tongue every 5 (five) minutes as needed for chest pain., Disp: 25 tablet, Rfl: 3  Review of Systems  Constitutional: Negative for appetite change, chills, diaphoresis, fatigue, fever and unexpected weight change.  HENT: Negative for congestion, dental problem, drooling, ear pain, facial swelling, hearing loss, mouth sores, sneezing, sore throat, trouble swallowing and voice change.   Eyes: Negative for pain, discharge, redness, itching and visual disturbance.  Respiratory: Negative for cough, choking, shortness of breath and wheezing.   Cardiovascular: Negative for chest pain, palpitations and leg swelling.  Gastrointestinal:  Negative for abdominal pain, blood in stool, constipation, diarrhea and vomiting.  Endocrine: Negative for cold intolerance, heat intolerance and polydipsia.  Genitourinary: Negative for decreased urine volume, dysuria and hematuria.  Musculoskeletal: Negative for arthralgias, back pain and gait problem.  Skin: Negative for rash.  Allergic/Immunologic: Negative for environmental allergies.  Neurological: Negative for seizures, syncope and headaches.  Hematological: Negative for adenopathy.  Psychiatric/Behavioral: Negative for agitation, dysphoric mood and suicidal ideas. The patient is not nervous/anxious.     Per HPI unless specifically indicated above     Objective:    BP 122/86 (BP Location: Left Arm, Patient Position: Sitting, Cuff Size: Normal)   Pulse 68   Temp 97.7 F (36.5 C) (Other (Comment))   Ht  (1.778 m)   Wt 172 lb (78 kg)   SpO2 98%   BMI 24.68 kg/m   Wt Readings from Last 3 Encounters:  03/11/16 172 lb (78 kg)  02/28/16 172 lb (78 kg)  02/05/16 176 lb (79.8 kg)    Physical Exam  Constitutional: He is oriented to person, place, and time. He appears well-developed and well-nourished.  HENT:  Head: Normocephalic and atraumatic.  Neck: Neck supple.  Cardiovascular: Normal rate and regular rhythm.   Pulmonary/Chest: Effort normal and breath sounds normal. He has no wheezes.  Abdominal: Soft. Bowel sounds are normal. There is no hepatosplenomegaly. There is no tenderness.  Musculoskeletal: He exhibits no edema.  Lymphadenopathy:    He has no cervical adenopathy.  Neurological: He is alert  and oriented to person, place, and time.  Skin: Skin is warm and dry.  Psychiatric: He has a normal mood and affect. His behavior is normal.  Vitals reviewed.       Assessment & Plan:   Encounter Diagnoses  Name Primary?  . Essential hypertension, benign Yes  . Cigarette nicotine dependence, uncomplicated      -Continue current medications -counseled pt to  eat nutritious meals and eating some breakfast helps avoid feeling bad due to not eating -F/u 2 months.  RTO sooner prn

## 2016-03-19 ENCOUNTER — Encounter: Payer: Self-pay | Admitting: Cardiology

## 2016-04-19 ENCOUNTER — Ambulatory Visit: Payer: Self-pay | Admitting: Cardiology

## 2016-04-19 ENCOUNTER — Ambulatory Visit (INDEPENDENT_AMBULATORY_CARE_PROVIDER_SITE_OTHER): Payer: Self-pay | Admitting: Cardiology

## 2016-04-19 ENCOUNTER — Encounter: Payer: Self-pay | Admitting: Cardiology

## 2016-04-19 VITALS — BP 114/75 | HR 75 | Ht 69.0 in | Wt 166.0 lb

## 2016-04-19 DIAGNOSIS — I10 Essential (primary) hypertension: Secondary | ICD-10-CM

## 2016-04-19 DIAGNOSIS — R079 Chest pain, unspecified: Secondary | ICD-10-CM

## 2016-04-19 NOTE — Progress Notes (Signed)
Clinical Summary Melvin Hernandez is a 45 y.o.male seen for follow up of the following medical problems.   1. Chest pain - started about 1 month ago. Funny feeling in left chest down left arm. Can occur at rest or with activity. Can have some nasuea, lightheadness. Lasts just a few seconds. Occurs daily, multiple times. Not positional.  - low energy. No sob or DOE - no LE edema, no orthopnea, no pnd CAD risk factors: HTN, +tobacco x 26 years  - no recurrent chest pain since last visit. Since last visit he completed a GXT that was negative for ischemia with low risk Duke treadmill score.   2. HTN - recently started on norvasc 5mg  daily by pcp - compliant with meds, but has not taken yet today. - 12/2015 started on lopressor. Since that time has had some dizziness.   - last week lopressor sropped due to dizziness and low heart rates, started on chlrorthalidone 12.5mg  daily - dizziness improved since last visit.      Past Medical History:  Diagnosis Date  . Hypertension      No Known Allergies   Current Outpatient Prescriptions  Medication Sig Dispense Refill  . amLODipine (NORVASC) 5 MG tablet Take 1 tablet (5 mg total) by mouth daily. 30 tablet 3  . chlorthalidone (HYGROTON) 25 MG tablet Take 0.5 tablets (12.5 mg total) by mouth daily. 30 tablet 3  . lisinopril (PRINIVIL,ZESTRIL) 20 MG tablet Take 1 tablet (20 mg total) by mouth 2 (two) times daily. 60 tablet 2  . nitroGLYCERIN (NITROSTAT) 0.4 MG SL tablet Place 1 tablet (0.4 mg total) under the tongue every 5 (five) minutes as needed for chest pain. 25 tablet 3   No current facility-administered medications for this visit.      No past surgical history on file.   No Known Allergies    Family History  Problem Relation Age of Onset  . Hypertension Mother      Social History Melvin Hernandez reports that he has been smoking Cigarettes.  He has a 13.50 pack-year smoking history. He has never used smokeless tobacco. Mr.  Hernandez reports that he drinks alcohol.   Review of Systems CONSTITUTIONAL: No weight loss, fever, chills, weakness or fatigue.  HEENT: Eyes: No visual loss, blurred vision, double vision or yellow sclerae.No hearing loss, sneezing, congestion, runny nose or sore throat.  SKIN: No rash or itching.  CARDIOVASCULAR: per HPI RESPIRATORY: No shortness of breath, cough or sputum.  GASTROINTESTINAL: No anorexia, nausea, vomiting or diarrhea. No abdominal pain or blood.  GENITOURINARY: No burning on urination, no polyuria NEUROLOGICAL: No headache, dizziness, syncope, paralysis, ataxia, numbness or tingling in the extremities. No change in bowel or bladder control.  MUSCULOSKELETAL: No muscle, back pain, joint pain or stiffness.  LYMPHATICS: No enlarged nodes. No history of splenectomy.  PSYCHIATRIC: No history of depression or anxiety.  ENDOCRINOLOGIC: No reports of sweating, cold or heat intolerance. No polyuria or polydipsia.  Marland Kitchen   Physical Examination Vitals:   04/19/16 0900  BP: 114/75  Pulse: 75   Vitals:   04/19/16 0900  Weight: 166 lb (75.3 kg)  Height: 5\' 9"  (1.753 m)    Gen: resting comfortably, no acute distress HEENT: no scleral icterus, pupils equal round and reactive, no palptable cervical adenopathy,  CV: RRR, no m/r/g, no jvd Resp: Clear to auscultation bilaterally GI: abdomen is soft, non-tender, non-distended, normal bowel sounds, no hepatosplenomegaly MSK: extremities are warm, no edema.  Skin: warm, no rash  Neuro:  no focal deficits Psych: appropriate affect   Diagnostic Studies  03/2016 GXT  No diagnostic ST segment changes to indicate ischemia. No significant arrhythmias. Low risk Duke treadmill score of 10.5.  Blood pressure demonstrated a hypertensive response to exercise.   Assessment and Plan  1. Chest pain - negative GXT for ischemia, excellent exericse funcitonal capacity. Symptoms have resolved - no further workup at this time.   2. HTN - at  goal, continue current meds.   F/u as needed       Antoine PocheJonathan F. Alixis Harmon, M.D., F.A.C.C.

## 2016-04-19 NOTE — Patient Instructions (Signed)
Your physician recommends that you schedule a follow-up appointment in: as needed   Thank you for choosing [facility] !         

## 2016-05-09 ENCOUNTER — Ambulatory Visit: Payer: Self-pay | Admitting: Physician Assistant

## 2016-05-16 ENCOUNTER — Encounter: Payer: Self-pay | Admitting: Physician Assistant

## 2016-05-16 ENCOUNTER — Ambulatory Visit: Payer: Self-pay | Admitting: Physician Assistant

## 2016-05-16 VITALS — BP 98/70 | HR 86 | Temp 98.1°F | Ht 70.0 in | Wt 167.2 lb

## 2016-05-16 DIAGNOSIS — F17210 Nicotine dependence, cigarettes, uncomplicated: Secondary | ICD-10-CM

## 2016-05-16 DIAGNOSIS — I10 Essential (primary) hypertension: Secondary | ICD-10-CM

## 2016-05-16 NOTE — Progress Notes (Signed)
BP 98/70 (BP Location: Left Arm, Patient Position: Sitting, Cuff Size: Normal)   Pulse 86   Temp 98.1 F (36.7 C) (Other (Comment))   Ht 5\' 10"  (1.778 m)   Wt 167 lb 3.2 oz (75.8 kg)   SpO2 98%   BMI 23.99 kg/m    Subjective:    Patient ID: Melvin Hernandez, male    DOB: 04-27-1971, 45 y.o.   MRN: [id number]  HPI: Melvin Hernandez is a 45 y.o. male presenting on 05/16/2016 for Hypertension   HPI   Pt feeling good today.  He said he was having some light-headedness several weeks ago occurred when he stood up.  He says that since that time he has started eating breakfast and he has not had any more of the light-headedness  Relevant past medical, surgical, family and social history reviewed and updated as indicated. Interim medical history since our last visit reviewed. Allergies and medications reviewed and updated.   Current Outpatient Prescriptions:  .  amLODipine (NORVASC) 5 MG tablet, Take 1 tablet (5 mg total) by mouth daily., Disp: 30 tablet, Rfl: 3 .  chlorthalidone (HYGROTON) 25 MG tablet, Take 0.5 tablets (12.5 mg total) by mouth daily., Disp: 30 tablet, Rfl: 3 .  lisinopril (PRINIVIL,ZESTRIL) 20 MG tablet, Take 1 tablet (20 mg total) by mouth 2 (two) times daily., Disp: 60 tablet, Rfl: 2 .  nitroGLYCERIN (NITROSTAT) 0.4 MG SL tablet, Place 1 tablet (0.4 mg total) under the tongue every 5 (five) minutes as needed for chest pain., Disp: 25 tablet, Rfl: 3   Review of Systems  Constitutional: Negative for appetite change, chills, diaphoresis, fatigue, fever and unexpected weight change.  HENT: Negative for congestion, drooling, ear pain, facial swelling, hearing loss, mouth sores, sneezing, sore throat, trouble swallowing and voice change.   Eyes: Negative for pain, discharge, redness, itching and visual disturbance.  Respiratory: Negative for cough, choking, shortness of breath and wheezing.   Cardiovascular: Negative for chest pain, palpitations and leg swelling.   Gastrointestinal: Negative for abdominal pain, blood in stool, constipation, diarrhea and vomiting.  Endocrine: Negative for cold intolerance, heat intolerance and polydipsia.  Genitourinary: Negative for decreased urine volume, dysuria and hematuria.  Musculoskeletal: Negative for arthralgias, back pain and gait problem.  Skin: Negative for rash.  Allergic/Immunologic: Negative for environmental allergies.  Neurological: Negative for seizures, syncope, light-headedness and headaches.  Hematological: Negative for adenopathy.  Psychiatric/Behavioral: Negative for agitation, dysphoric mood and suicidal ideas. The patient is not nervous/anxious.     Per HPI unless specifically indicated above     Objective:    BP 98/70 (BP Location: Left Arm, Patient Position: Sitting, Cuff Size: Normal)   Pulse 86   Temp 98.1 F (36.7 C) (Other (Comment))   Ht 5\' 10"  (1.778 m)   Wt 167 lb 3.2 oz (75.8 kg)   SpO2 98%   BMI 23.99 kg/m   Wt Readings from Last 3 Encounters:  05/16/16 167 lb 3.2 oz (75.8 kg)  04/19/16 166 lb (75.3 kg)  03/11/16 172 lb (78 kg)    Physical Exam  Constitutional: He is oriented to person, place, and time. He appears well-developed and well-nourished.  HENT:  Head: Normocephalic and atraumatic.  Neck: Neck supple.  Cardiovascular: Normal rate and regular rhythm.   Pulmonary/Chest: Effort normal and breath sounds normal. He has no wheezes.  Abdominal: Soft. Bowel sounds are normal. There is no hepatosplenomegaly. There is no tenderness.  Musculoskeletal: He exhibits no edema.  Lymphadenopathy:  He has no cervical adenopathy.  Neurological: He is alert and oriented to person, place, and time.  Skin: Skin is warm and dry.  Psychiatric: He has a normal mood and affect. His behavior is normal.  Vitals reviewed.   Results for orders placed or performed in visit on 03/11/16  Basic metabolic panel  Result Value Ref Range   Sodium 138 135 - 146 mmol/L   Potassium  4.3 3.5 - 5.3 mmol/L   Chloride 98 98 - 110 mmol/L   CO2 27 20 - 31 mmol/L   Glucose, Bld 100 (H) 65 - 99 mg/dL   BUN 19 7 - 25 mg/dL   Creat 0.981.17 1.190.60 - 1.471.35 mg/dL   Calcium 82.910.1 8.6 - 56.210.3 mg/dL      Assessment & Plan:   Encounter Diagnoses  Name Primary?  . Essential hypertension, benign Yes  . Cigarette nicotine dependence, uncomplicated     -reviewed recent bp readings and discussed with pt..  Will continue current medications -Check bmp today. Will call with results -counseled on smoking cessation -F/u 3 months.  RTO sooner for any light-headedness or other problems

## 2016-08-15 ENCOUNTER — Ambulatory Visit: Payer: Self-pay | Admitting: Physician Assistant

## 2016-08-21 ENCOUNTER — Ambulatory Visit: Payer: Self-pay | Admitting: Physician Assistant

## 2016-08-26 ENCOUNTER — Ambulatory Visit: Payer: Self-pay | Admitting: Physician Assistant

## 2016-08-27 ENCOUNTER — Ambulatory Visit: Payer: Self-pay | Admitting: Physician Assistant

## 2016-08-27 ENCOUNTER — Encounter: Payer: Self-pay | Admitting: Physician Assistant

## 2016-08-27 VITALS — BP 128/86 | HR 71 | Temp 98.1°F | Ht 70.0 in | Wt 171.0 lb

## 2016-08-27 DIAGNOSIS — K5900 Constipation, unspecified: Secondary | ICD-10-CM

## 2016-08-27 DIAGNOSIS — F17210 Nicotine dependence, cigarettes, uncomplicated: Secondary | ICD-10-CM

## 2016-08-27 DIAGNOSIS — I10 Essential (primary) hypertension: Secondary | ICD-10-CM

## 2016-08-27 MED ORDER — CHLORTHALIDONE 25 MG PO TABS: 12.5000 mg | ORAL_TABLET | Freq: Every day | ORAL | 0 refills | Status: DC

## 2016-08-27 MED ORDER — LISINOPRIL 20 MG PO TABS: 20.0000 mg | ORAL_TABLET | Freq: Two times a day (BID) | ORAL | 0 refills | Status: DC

## 2016-08-27 MED ORDER — AMLODIPINE BESYLATE 5 MG PO TABS: 5.0000 mg | ORAL_TABLET | Freq: Every day | ORAL | 0 refills | Status: DC

## 2016-08-27 NOTE — Patient Instructions (Signed)

## 2016-08-27 NOTE — Progress Notes (Signed)
BP 128/86 (BP Location: Left Arm, Patient Position: Sitting, Cuff Size: Normal)   Pulse 71   Temp 98.1 F (36.7 C)   Ht 5\' 10"  (1.778 m)   Wt 171 lb (77.6 kg)   SpO2 99%   BMI 24.54 kg/m    Subjective:    Patient ID: Melvin Hernandez, male    DOB: 1971-06-20, 46 y.o.   MRN: [id number]  HPI: Melvin CantorSteven L Suppes is a 46 y.o. male presenting on 08/27/2016 for Hypertension   HPI   Pt ran out of his chlorthalidone on Friday- about 4 days ago.   Pt did not ever get bmp drawn in October as ordered  He says he is doing well  Relevant past medical, surgical, family and social history reviewed and updated as indicated. Interim medical history since our last visit reviewed. Allergies and medications reviewed and updated.   Current Outpatient Prescriptions:  .  amLODipine (NORVASC) 5 MG tablet, Take 1 tablet (5 mg total) by mouth daily., Disp: 30 tablet, Rfl: 3 .  lisinopril (PRINIVIL,ZESTRIL) 20 MG tablet, Take 1 tablet (20 mg total) by mouth 2 (two) times daily., Disp: 60 tablet, Rfl: 2 .  nitroGLYCERIN (NITROSTAT) 0.4 MG SL tablet, Place 1 tablet (0.4 mg total) under the tongue every 5 (five) minutes as needed for chest pain., Disp: 25 tablet, Rfl: 3 .  chlorthalidone (HYGROTON) 25 MG tablet, Take 0.5 tablets (12.5 mg total) by mouth daily. (Patient not taking: Reported on 08/27/2016), Disp: 30 tablet, Rfl: 3  Review of Systems  Constitutional: Negative for appetite change, chills, diaphoresis, fatigue, fever and unexpected weight change.  HENT: Negative for congestion, drooling, ear pain, facial swelling, hearing loss, mouth sores, sneezing, sore throat, trouble swallowing and voice change.   Eyes: Negative for pain, discharge, redness, itching and visual disturbance.  Respiratory: Negative for cough, choking, shortness of breath and wheezing.   Cardiovascular: Negative for chest pain, palpitations and leg swelling.  Gastrointestinal: Positive for abdominal pain and constipation. Negative  for blood in stool, diarrhea and vomiting.  Endocrine: Negative for cold intolerance, heat intolerance and polydipsia.  Genitourinary: Negative for decreased urine volume, dysuria and hematuria.  Musculoskeletal: Negative for arthralgias, back pain and gait problem.  Skin: Negative for rash.  Allergic/Immunologic: Negative for environmental allergies.  Neurological: Negative for seizures, syncope, light-headedness and headaches.  Hematological: Negative for adenopathy.  Psychiatric/Behavioral: Negative for agitation, dysphoric mood and suicidal ideas. The patient is not nervous/anxious.     Per HPI unless specifically indicated above     Objective:    BP 128/86 (BP Location: Left Arm, Patient Position: Sitting, Cuff Size: Normal)   Pulse 71   Temp 98.1 F (36.7 C)   Ht 5\' 10"  (1.778 m)   Wt 171 lb (77.6 kg)   SpO2 99%   BMI 24.54 kg/m   Wt Readings from Last 3 Encounters:  08/27/16 171 lb (77.6 kg)  05/16/16 167 lb 3.2 oz (75.8 kg)  04/19/16 166 lb (75.3 kg)    Physical Exam  Constitutional: He is oriented to person, place, and time. He appears well-developed and well-nourished.  HENT:  Head: Normocephalic and atraumatic.  Neck: Neck supple.  Cardiovascular: Normal rate and regular rhythm.   Pulmonary/Chest: Effort normal and breath sounds normal. He has no wheezes.  Abdominal: Soft. Bowel sounds are normal. There is no hepatosplenomegaly. There is no tenderness.  Musculoskeletal: He exhibits no edema.  Lymphadenopathy:    He has no cervical adenopathy.  Neurological: He is alert  and oriented to person, place, and time.  Skin: Skin is warm and dry.  Psychiatric: He has a normal mood and affect. His behavior is normal.  Vitals reviewed.       Assessment & Plan:   Encounter Diagnoses  Name Primary?  . Essential hypertension, benign Yes  . Cigarette nicotine dependence, uncomplicated   . Constipation, unspecified constipation type     -Check bmp  today -counseled smoking cessation -counseled on constipation and gave handout.  Encouraged to increase water/fluids and fiber in diet -continue current medications -follow up in 3 months.  RTO sooner prn

## 2016-11-26 ENCOUNTER — Ambulatory Visit: Payer: Self-pay | Admitting: Physician Assistant

## 2016-12-03 ENCOUNTER — Encounter: Payer: Self-pay | Admitting: Physician Assistant

## 2016-12-03 ENCOUNTER — Ambulatory Visit: Payer: Self-pay | Admitting: Physician Assistant

## 2016-12-03 VITALS — BP 164/96 | HR 60 | Temp 97.9°F | Ht 70.0 in | Wt 170.0 lb

## 2016-12-03 DIAGNOSIS — F17210 Nicotine dependence, cigarettes, uncomplicated: Secondary | ICD-10-CM

## 2016-12-03 DIAGNOSIS — I10 Essential (primary) hypertension: Secondary | ICD-10-CM

## 2016-12-03 LAB — BASIC METABOLIC PANEL
BUN: 17 mg/dL (ref 7–25)
CHLORIDE: 107 mmol/L (ref 98–110)
CO2: 27 mmol/L (ref 20–31)
CREATININE: 1.15 mg/dL (ref 0.60–1.35)
Calcium: 9.6 mg/dL (ref 8.6–10.3)
Glucose, Bld: 96 mg/dL (ref 65–99)
POTASSIUM: 4.2 mmol/L (ref 3.5–5.3)
SODIUM: 139 mmol/L (ref 135–146)

## 2016-12-03 MED ORDER — CHLORTHALIDONE 25 MG PO TABS: 12.5000 mg | ORAL_TABLET | Freq: Every day | ORAL | 0 refills | Status: DC

## 2016-12-03 NOTE — Patient Instructions (Signed)
Steps to Quit Smoking Smoking tobacco can be harmful to your health and can affect almost every organ in your body. Smoking puts you, and those around you, at risk for developing many serious chronic diseases. Quitting smoking is difficult, but it is one of the best things that you can do for your health. It is never too late to quit. What are the benefits of quitting smoking? When you quit smoking, you lower your risk of developing serious diseases and conditions, such as:  Lung cancer or lung disease, such as COPD.  Heart disease.  Stroke.  Heart attack.  Infertility.  Osteoporosis and bone fractures.  Additionally, symptoms such as coughing, wheezing, and shortness of breath may get better when you quit. You may also find that you get sick less often because your body is stronger at fighting off colds and infections. If you are pregnant, quitting smoking can help to reduce your chances of having a baby of low birth weight. How do I get ready to quit? When you decide to quit smoking, create a plan to make sure that you are successful. Before you quit:  Pick a date to quit. Set a date within the next two weeks to give you time to prepare.  Write down the reasons why you are quitting. Keep this list in places where you will see it often, such as on your bathroom mirror or in your car or wallet.  Identify the people, places, things, and activities that make you want to smoke (triggers) and avoid them. Make sure to take these actions: ? Throw away all cigarettes at home, at work, and in your car. ? Throw away smoking accessories, such as ashtrays and lighters. ? Clean your car and make sure to empty the ashtray. ? Clean your home, including curtains and carpets.  Tell your family, friends, and coworkers that you are quitting. Support from your loved ones can make quitting easier.  Talk with your health care provider about your options for quitting smoking.  Find out what treatment  options are covered by your health insurance.  What strategies can I use to quit smoking? Talk with your healthcare provider about different strategies to quit smoking. Some strategies include:  Quitting smoking altogether instead of gradually lessening how much you smoke over a period of time. Research shows that quitting "cold turkey" is more successful than gradually quitting.  Attending in-person counseling to help you build problem-solving skills. You are more likely to have success in quitting if you attend several counseling sessions. Even short sessions of 10 minutes can be effective.  Finding resources and support systems that can help you to quit smoking and remain smoke-free after you quit. These resources are most helpful when you use them often. They can include: ? Online chats with a counselor. ? Telephone quitlines. ? Printed self-help materials. ? Support groups or group counseling. ? Text messaging programs. ? Mobile phone applications.  Taking medicines to help you quit smoking. (If you are pregnant or breastfeeding, talk with your health care provider first.) Some medicines contain nicotine and some do not. Both types of medicines help with cravings, but the medicines that include nicotine help to relieve withdrawal symptoms. Your health care provider may recommend: ? Nicotine patches, gum, or lozenges. ? Nicotine inhalers or sprays. ? Non-nicotine medicine that is taken by mouth.  Talk with your health care provider about combining strategies, such as taking medicines while you are also receiving in-person counseling. Using these two strategies together   makes you more likely to succeed in quitting than if you used either strategy on its own. If you are pregnant or breastfeeding, talk with your health care provider about finding counseling or other support strategies to quit smoking. Do not take medicine to help you quit smoking unless told to do so by your health care  provider. What things can I do to make it easier to quit? Quitting smoking might feel overwhelming at first, but there is a lot that you can do to make it easier. Take these important actions:  Reach out to your family and friends and ask that they support and encourage you during this time. Call telephone quitlines, reach out to support groups, or work with a counselor for support.  Ask people who smoke to avoid smoking around you.  Avoid places that trigger you to smoke, such as bars, parties, or smoke-break areas at work.  Spend time around people who do not smoke.  Lessen stress in your life, because stress can be a smoking trigger for some people. To lessen stress, try: ? Exercising regularly. ? Deep-breathing exercises. ? Yoga. ? Meditating. ? Performing a body scan. This involves closing your eyes, scanning your body from head to toe, and noticing which parts of your body are particularly tense. Purposefully relax the muscles in those areas.  Download or purchase mobile phone or tablet apps (applications) that can help you stick to your quit plan by providing reminders, tips, and encouragement. There are many free apps, such as QuitGuide from the CDC (Centers for Disease Control and Prevention). You can find other support for quitting smoking (smoking cessation) through smokefree.gov and other websites.  How will I feel when I quit smoking? Within the first 24 hours of quitting smoking, you may start to feel some withdrawal symptoms. These symptoms are usually most noticeable 2-3 days after quitting, but they usually do not last beyond 2-3 weeks. Changes or symptoms that you might experience include:  Mood swings.  Restlessness, anxiety, or irritation.  Difficulty concentrating.  Dizziness.  Strong cravings for sugary foods in addition to nicotine.  Mild weight gain.  Constipation.  Nausea.  Coughing or a sore throat.  Changes in how your medicines work in your  body.  A depressed mood.  Difficulty sleeping (insomnia).  After the first 2-3 weeks of quitting, you may start to notice more positive results, such as:  Improved sense of smell and taste.  Decreased coughing and sore throat.  Slower heart rate.  Lower blood pressure.  Clearer skin.  The ability to breathe more easily.  Fewer sick days.  Quitting smoking is very challenging for most people. Do not get discouraged if you are not successful the first time. Some people need to make many attempts to quit before they achieve long-term success. Do your best to stick to your quit plan, and talk with your health care provider if you have any questions or concerns. This information is not intended to replace advice given to you by your health care provider. Make sure you discuss any questions you have with your health care provider. Document Released: 07/16/2001 Document Revised: 03/19/2016 Document Reviewed: 12/06/2014 Elsevier Interactive Patient Education  2017 [employer]  

## 2016-12-03 NOTE — Progress Notes (Signed)
BP (!) 164/96 (BP Location: Left Arm, Patient Position: Sitting, Cuff Size: Normal)   Pulse 60   Temp 97.9 F (36.6 C) (Other (Comment))   Ht  (1.778 m)   Wt 170 lb (77.1 kg)   SpO2 99%   BMI 24.39 kg/m    Subjective:    Patient ID: Melvin Hernandez CLASS, male    DOB: Dec 28, 1970, 46 y.o.   MRN: [id number]  HPI: Melvin Hernandez is a 46 y.o. male presenting on 12/03/2016 for Hypertension   HPI   Pt says he cannot afford the chlortalidone.  It was started by cardiology because pt was having dizziness with atenolol.    He says he is feeling well  Relevant past medical, surgical, family and social history reviewed and updated as indicated. Interim medical history since our last visit reviewed. Allergies and medications reviewed and updated.   Current Outpatient Prescriptions:  .  amLODipine (NORVASC) 5 MG tablet, Take 1 tablet (5 mg total) by mouth daily., Disp: 30 tablet, Rfl: 0 .  lisinopril (PRINIVIL,ZESTRIL) 20 MG tablet, Take 1 tablet (20 mg total) by mouth 2 (two) times daily., Disp: 60 tablet, Rfl: 0 .  nitroGLYCERIN (NITROSTAT) 0.4 MG SL tablet, Place 1 tablet (0.4 mg total) under the tongue every 5 (five) minutes as needed for chest pain., Disp: 25 tablet, Rfl: 3 .  chlorthalidone (HYGROTON) 25 MG tablet, Take 0.5 tablets (12.5 mg total) by mouth daily. (Patient not taking: Reported on 12/03/2016), Disp: 30 tablet, Rfl: 0   Review of Systems  Constitutional: Negative for appetite change, chills, diaphoresis, fatigue, fever and unexpected weight change.  HENT: Negative for congestion, dental problem, drooling, ear pain, facial swelling, hearing loss, mouth sores, sneezing, sore throat, trouble swallowing and voice change.   Eyes: Negative for pain, discharge, redness, itching and visual disturbance.  Respiratory: Negative for cough, choking, shortness of breath and wheezing.   Cardiovascular: Negative for chest pain, palpitations and leg swelling.  Gastrointestinal: Negative for  abdominal pain, blood in stool, constipation, diarrhea and vomiting.  Endocrine: Negative for cold intolerance, heat intolerance and polydipsia.  Genitourinary: Negative for decreased urine volume, dysuria and hematuria.  Musculoskeletal: Negative for arthralgias, back pain and gait problem.  Skin: Negative for rash.  Allergic/Immunologic: Negative for environmental allergies.  Neurological: Negative for seizures, syncope, light-headedness and headaches.  Hematological: Negative for adenopathy.  Psychiatric/Behavioral: Negative for agitation, dysphoric mood and suicidal ideas. The patient is not nervous/anxious.     Per HPI unless specifically indicated above     Objective:    BP (!) 164/96 (BP Location: Left Arm, Patient Position: Sitting, Cuff Size: Normal)   Pulse 60   Temp 97.9 F (36.6 C) (Other (Comment))   Ht  (1.778 m)   Wt 170 lb (77.1 kg)   SpO2 99%   BMI 24.39 kg/m   Wt Readings from Last 3 Encounters:  12/03/16 170 lb (77.1 kg)  08/27/16 171 lb (77.6 kg)  05/16/16 167 lb 3.2 oz (75.8 kg)    Physical Exam  Constitutional: He is oriented to person, place, and time. He appears well-developed and well-nourished.  HENT:  Head: Normocephalic and atraumatic.  Neck: Neck supple.  Cardiovascular: Normal rate and regular rhythm.   Pulmonary/Chest: Effort normal and breath sounds normal. He has no wheezes.  Abdominal: Soft. Bowel sounds are normal. There is no hepatosplenomegaly. There is no tenderness.  Musculoskeletal: He exhibits no edema.  Lymphadenopathy:    He has no cervical adenopathy.  Neurological:  He is alert and oriented to person, place, and time.  Skin: Skin is warm and dry.  Psychiatric: He has a normal mood and affect. His behavior is normal.  Vitals reviewed.   Results for orders placed or performed in visit on 08/27/16  Basic Metabolic Panel (BMET)  Result Value Ref Range   Sodium 139 135 - 146 mmol/L   Potassium 4.2 3.5 - 5.3 mmol/L    Chloride 107 98 - 110 mmol/L   CO2 27 20 - 31 mmol/L   Glucose, Bld 96 65 - 99 mg/dL   BUN 17 7 - 25 mg/dL   Creat 1.61 0.96 - 0.45 mg/dL   Calcium 9.6 8.6 - 40.9 mg/dL      Assessment & Plan:   Encounter Diagnoses  Name Primary?  . Essential hypertension, benign Yes  . Cigarette nicotine dependence, uncomplicated     -reviewed labs with pt -Discussed discontinuing the chlrothalidone but pt really wants to continue it so he was given a coupon -counseled smoking cessation -follow up one month to recheck BP.  RTO sooner prn

## 2017-01-06 ENCOUNTER — Encounter: Payer: Self-pay | Admitting: Physician Assistant

## 2017-01-06 ENCOUNTER — Ambulatory Visit: Payer: Self-pay | Admitting: Physician Assistant

## 2017-01-06 VITALS — BP 142/90 | HR 67 | Temp 97.7°F | Ht 70.0 in | Wt 168.2 lb

## 2017-01-06 DIAGNOSIS — F17210 Nicotine dependence, cigarettes, uncomplicated: Secondary | ICD-10-CM

## 2017-01-06 DIAGNOSIS — I10 Essential (primary) hypertension: Secondary | ICD-10-CM

## 2017-01-06 NOTE — Progress Notes (Signed)
BP (!) 142/90 (BP Location: Left Arm, Patient Position: Sitting, Cuff Size: Normal)   Pulse 67   Temp 97.7 F (36.5 C)   Ht 5\' 10"  (1.778 m)   Wt 168 lb 4 oz (76.3 kg)   SpO2 98%   BMI 24.14 kg/m    Subjective:    Patient ID: Melvin Hernandez, male    DOB: 1971/01/04, 46 y.o.   MRN: [id number]  HPI: Melvin Hernandez is a 46 y.o. male presenting on 01/06/2017 for Hypertension   HPI   Pt doing well today.  He hasn't taken his meds yet today.   Relevant past medical, surgical, family and social history reviewed and updated as indicated. Interim medical history since our last visit reviewed. Allergies and medications reviewed and updated.   Current Outpatient Prescriptions:  .  amLODipine (NORVASC) 5 MG tablet, Take 1 tablet (5 mg total) by mouth daily., Disp: 30 tablet, Rfl: 0 .  chlorthalidone (HYGROTON) 25 MG tablet, Take 0.5 tablets (12.5 mg total) by mouth daily., Disp: 30 tablet, Rfl: 0 .  lisinopril (PRINIVIL,ZESTRIL) 20 MG tablet, Take 1 tablet (20 mg total) by mouth 2 (two) times daily., Disp: 60 tablet, Rfl: 0 .  nitroGLYCERIN (NITROSTAT) 0.4 MG SL tablet, Place 1 tablet (0.4 mg total) under the tongue every 5 (five) minutes as needed for chest pain., Disp: 25 tablet, Rfl: 3   Review of Systems  Constitutional: Negative for appetite change, chills, diaphoresis, fatigue, fever and unexpected weight change.  HENT: Negative for congestion, dental problem, drooling, ear pain, facial swelling, hearing loss, mouth sores, sneezing, sore throat, trouble swallowing and voice change.   Eyes: Negative for pain, discharge, redness, itching and visual disturbance.  Respiratory: Negative for cough, choking, shortness of breath and wheezing.   Cardiovascular: Negative for chest pain, palpitations and leg swelling.  Gastrointestinal: Negative for abdominal pain, blood in stool, constipation, diarrhea and vomiting.  Endocrine: Negative for cold intolerance, heat intolerance and polydipsia.   Genitourinary: Negative for decreased urine volume, dysuria and hematuria.  Musculoskeletal: Negative for arthralgias, back pain and gait problem.  Skin: Negative for rash.  Allergic/Immunologic: Negative for environmental allergies.  Neurological: Negative for seizures, syncope, light-headedness and headaches.  Hematological: Negative for adenopathy.  Psychiatric/Behavioral: Negative for agitation, dysphoric mood and suicidal ideas. The patient is not nervous/anxious.     Per HPI unless specifically indicated above     Objective:    BP (!) 142/90 (BP Location: Left Arm, Patient Position: Sitting, Cuff Size: Normal)   Pulse 67   Temp 97.7 F (36.5 C)   Ht 5\' 10"  (1.778 m)   Wt 168 lb 4 oz (76.3 kg)   SpO2 98%   BMI 24.14 kg/m   Wt Readings from Last 3 Encounters:  01/06/17 168 lb 4 oz (76.3 kg)  12/03/16 170 lb (77.1 kg)  08/27/16 171 lb (77.6 kg)    Physical Exam  Constitutional: He is oriented to person, place, and time. He appears well-developed and well-nourished.  HENT:  Head: Normocephalic and atraumatic.  Neck: Neck supple.  Cardiovascular: Normal rate and regular rhythm.   Pulmonary/Chest: Effort normal and breath sounds normal. He has no wheezes.  Abdominal: Soft. Bowel sounds are normal. There is no hepatosplenomegaly. There is no tenderness.  Musculoskeletal: He exhibits no edema.  Lymphadenopathy:    He has no cervical adenopathy.  Neurological: He is alert and oriented to person, place, and time.  Skin: Skin is warm and dry.  Psychiatric: He has a  normal mood and affect. His behavior is normal.  Vitals reviewed.       Assessment & Plan:   Encounter Diagnoses  Name Primary?  . Essential hypertension, benign Yes  . Cigarette nicotine dependence, uncomplicated      F/u 6 wk with bmp before appt

## 2017-01-21 ENCOUNTER — Other Ambulatory Visit: Payer: Self-pay | Admitting: Physician Assistant

## 2017-01-21 DIAGNOSIS — I10 Essential (primary) hypertension: Secondary | ICD-10-CM

## 2017-02-18 ENCOUNTER — Ambulatory Visit: Payer: Self-pay | Admitting: Physician Assistant

## 2017-02-25 ENCOUNTER — Ambulatory Visit: Payer: Self-pay | Admitting: Physician Assistant

## 2017-02-25 ENCOUNTER — Encounter: Payer: Self-pay | Admitting: Physician Assistant

## 2017-02-25 VITALS — BP 144/88 | HR 74 | Temp 97.5°F | Ht 70.0 in | Wt 162.0 lb

## 2017-02-25 DIAGNOSIS — F17290 Nicotine dependence, other tobacco product, uncomplicated: Secondary | ICD-10-CM

## 2017-02-25 DIAGNOSIS — I10 Essential (primary) hypertension: Secondary | ICD-10-CM

## 2017-02-25 MED ORDER — AMLODIPINE BESYLATE 10 MG PO TABS: 10.0000 mg | ORAL_TABLET | Freq: Every day | ORAL | 1 refills | Status: DC

## 2017-02-25 NOTE — Progress Notes (Signed)
BP (!) 144/88 (BP Location: Left Arm, Patient Position: Sitting, Cuff Size: Normal)   Pulse 74   Temp (!) 97.5 F (36.4 C) (Other (Comment))   Ht 5\' 10"  (1.778 m)   Wt 162 lb (73.5 kg)   SpO2 97%   BMI 23.24 kg/m    Subjective:    Patient ID: Melvin Hernandez, male    DOB: [birth year], 46 y.o.   MRN: [id number]  HPI: Melvin Hernandez is a 46 y.o. male presenting on 02/25/2017 for Hypertension   HPI   Pt feeling well.  He stopped smoking cigarettes. He still smokes cigars but is planning to stop that soon.    Pt did not get his labs drawn  Relevant past medical, surgical, family and social history reviewed and updated as indicated. Interim medical history since our last visit reviewed. Allergies and medications reviewed and updated.   Current Outpatient Prescriptions:  .  amLODipine (NORVASC) 5 MG tablet, Take 1 tablet (5 mg total) by mouth daily., Disp: 30 tablet, Rfl: 0 .  chlorthalidone (HYGROTON) 25 MG tablet, Take 0.5 tablets (12.5 mg total) by mouth daily., Disp: 30 tablet, Rfl: 0 .  lisinopril (PRINIVIL,ZESTRIL) 20 MG tablet, Take 1 tablet (20 mg total) by mouth 2 (two) times daily., Disp: 60 tablet, Rfl: 0 .  nitroGLYCERIN (NITROSTAT) 0.4 MG SL tablet, Place 1 tablet (0.4 mg total) under the tongue every 5 (five) minutes as needed for chest pain., Disp: 25 tablet, Rfl: 3   Review of Systems  Constitutional: Positive for appetite change. Negative for chills, diaphoresis, fatigue, fever and unexpected weight change.  HENT: Negative for congestion, dental problem, drooling, ear pain, facial swelling, hearing loss, mouth sores, sneezing, sore throat, trouble swallowing and voice change.   Eyes: Negative for pain, discharge, redness, itching and visual disturbance.  Respiratory: Negative for cough, choking, shortness of breath and wheezing.   Cardiovascular: Negative for chest pain, palpitations and leg swelling.  Gastrointestinal: Negative for abdominal pain, blood in stool,  constipation, diarrhea and vomiting.  Endocrine: Negative for cold intolerance, heat intolerance and polydipsia.  Genitourinary: Negative for decreased urine volume, dysuria and hematuria.  Musculoskeletal: Negative for arthralgias, back pain and gait problem.  Skin: Negative for rash.  Allergic/Immunologic: Negative for environmental allergies.  Neurological: Negative for seizures, syncope, light-headedness and headaches.  Hematological: Negative for adenopathy.  Psychiatric/Behavioral: Negative for agitation, dysphoric mood and suicidal ideas. The patient is not nervous/anxious.     Per HPI unless specifically indicated above     Objective:    BP (!) 144/88 (BP Location: Left Arm, Patient Position: Sitting, Cuff Size: Normal)   Pulse 74   Temp (!) 97.5 F (36.4 C) (Other (Comment))   Ht 5\' 10"  (1.778 m)   Wt 162 lb (73.5 kg)   SpO2 97%   BMI 23.24 kg/m   Wt Readings from Last 3 Encounters:  02/25/17 162 lb (73.5 kg)  01/06/17 168 lb 4 oz (76.3 kg)  12/03/16 170 lb (77.1 kg)    Physical Exam  Constitutional: He is oriented to person, place, and time. He appears well-developed and well-nourished.  HENT:  Head: Normocephalic and atraumatic.  Neck: Neck supple.  Cardiovascular: Normal rate and regular rhythm.   Pulmonary/Chest: Effort normal and breath sounds normal. He has no wheezes.  Abdominal: Soft. Bowel sounds are normal. There is no hepatosplenomegaly. There is no tenderness.  Musculoskeletal: He exhibits no edema.  Lymphadenopathy:    He has no cervical adenopathy.  Neurological: He is  alert and oriented to person, place, and time.  Skin: Skin is warm and dry.  Psychiatric: He has a normal mood and affect. His behavior is normal.  Vitals reviewed.       Assessment & Plan:    Encounter Diagnoses  Name Primary?  . Essential hypertension, benign Yes  . Cigar smoker     -pt to Get blood drawn when leaves office  -will Increase the amlodipine to 10mg .   Continue other meds -congratulations on stopping cigarettes and discussed stopping the cigars -pt to follow up to recheck the bp 6 weeks. RTO sooner prn

## 2017-04-08 ENCOUNTER — Ambulatory Visit: Payer: Self-pay | Admitting: Physician Assistant

## 2017-04-17 ENCOUNTER — Ambulatory Visit: Payer: Self-pay | Admitting: Physician Assistant

## 2017-04-22 ENCOUNTER — Ambulatory Visit: Payer: Self-pay | Admitting: Physician Assistant

## 2017-04-22 ENCOUNTER — Encounter: Payer: Self-pay | Admitting: Physician Assistant

## 2017-04-22 VITALS — BP 140/90 | HR 62 | Temp 97.7°F | Ht 70.0 in | Wt 164.8 lb

## 2017-04-22 DIAGNOSIS — I10 Essential (primary) hypertension: Secondary | ICD-10-CM

## 2017-04-22 DIAGNOSIS — L731 Pseudofolliculitis barbae: Secondary | ICD-10-CM

## 2017-04-22 DIAGNOSIS — Z91199 Patient's noncompliance with other medical treatment and regimen due to unspecified reason: Secondary | ICD-10-CM

## 2017-04-22 DIAGNOSIS — F17210 Nicotine dependence, cigarettes, uncomplicated: Secondary | ICD-10-CM

## 2017-04-22 DIAGNOSIS — Z9119 Patient's noncompliance with other medical treatment and regimen: Secondary | ICD-10-CM

## 2017-04-22 DIAGNOSIS — L739 Follicular disorder, unspecified: Secondary | ICD-10-CM

## 2017-04-22 MED ORDER — CEPHALEXIN 500 MG PO CAPS: 500.0000 mg | ORAL_CAPSULE | Freq: Three times a day (TID) | ORAL | 0 refills | Status: DC

## 2017-04-22 NOTE — Patient Instructions (Signed)
DASH Eating Plan DASH stands for "Dietary Approaches to Stop Hypertension." The DASH eating plan is a healthy eating plan that has been shown to reduce high blood pressure (hypertension). It may also reduce your risk for type 2 diabetes, heart disease, and stroke. The DASH eating plan may also help with weight loss. What are tips for following this plan? General guidelines  Avoid eating more than 2,300 mg (milligrams) of salt (sodium) a day. If you have hypertension, you may need to reduce your sodium intake to 1,500 mg a day.  Limit alcohol intake to no more than 1 drink a day for nonpregnant women and 2 drinks a day for men. One drink equals 12 oz of beer, 5 oz of wine, or 1 oz of hard liquor.  Work with your health care provider to maintain a healthy body weight or to lose weight. Ask what an ideal weight is for you.  Get at least 30 minutes of exercise that causes your heart to beat faster (aerobic exercise) most days of the week. Activities may include walking, swimming, or biking.  Work with your health care provider or diet and nutrition specialist (dietitian) to adjust your eating plan to your individual calorie needs. Reading food labels  Check food labels for the amount of sodium per serving. Choose foods with less than 5 percent of the Daily Value of sodium. Generally, foods with less than 300 mg of sodium per serving fit into this eating plan.  To find whole grains, look for the word "whole" as the first word in the ingredient list. Shopping  Buy products labeled as "low-sodium" or "no salt added."  Buy fresh foods. Avoid canned foods and premade or frozen meals. Cooking  Avoid adding salt when cooking. Use salt-free seasonings or herbs instead of table salt or sea salt. Check with your health care provider or pharmacist before using salt substitutes.  Do not fry foods. Cook foods using healthy methods such as baking, boiling, grilling, and broiling instead.  Cook with  heart-healthy oils, such as olive, canola, soybean, or sunflower oil. Meal planning   Eat a balanced diet that includes: ? 5 or more servings of fruits and vegetables each day. At each meal, try to fill half of your plate with fruits and vegetables. ? Up to 6-8 servings of whole grains each day. ? Less than 6 oz of lean meat, poultry, or fish each day. A 3-oz serving of meat is about the same size as a deck of cards. One egg equals 1 oz. ? 2 servings of low-fat dairy each day. ? A serving of nuts, seeds, or beans 5 times each week. ? Heart-healthy fats. Healthy fats called Omega-3 fatty acids are found in foods such as flaxseeds and coldwater fish, like sardines, salmon, and mackerel.  Limit how much you eat of the following: ? Canned or prepackaged foods. ? Food that is high in trans fat, such as fried foods. ? Food that is high in saturated fat, such as fatty meat. ? Sweets, desserts, sugary drinks, and other foods with added sugar. ? Full-fat dairy products.  Do not salt foods before eating.  Try to eat at least 2 vegetarian meals each week.  Eat more home-cooked food and less restaurant, buffet, and fast food.  When eating at a restaurant, ask that your food be prepared with less salt or no salt, if possible. What foods are recommended? The items listed may not be a complete list. Talk with your dietitian about what   dietary choices are best for you. Grains Whole-grain or whole-wheat bread. Whole-grain or whole-wheat pasta. Brown rice. Oatmeal. Quinoa. Bulgur. Whole-grain and low-sodium cereals. Pita bread. Low-fat, low-sodium crackers. Whole-wheat flour tortillas. Vegetables Fresh or frozen vegetables (raw, steamed, roasted, or grilled). Low-sodium or reduced-sodium tomato and vegetable juice. Low-sodium or reduced-sodium tomato sauce and tomato paste. Low-sodium or reduced-sodium canned vegetables. Fruits All fresh, dried, or frozen fruit. Canned fruit in natural juice (without  added sugar). Meat and other protein foods Skinless chicken or turkey. Ground chicken or turkey. Pork with fat trimmed off. Fish and seafood. Egg whites. Dried beans, peas, or lentils. Unsalted nuts, nut butters, and seeds. Unsalted canned beans. Lean cuts of beef with fat trimmed off. Low-sodium, lean deli meat. Dairy Low-fat (1%) or fat-free (skim) milk. Fat-free, low-fat, or reduced-fat cheeses. Nonfat, low-sodium ricotta or cottage cheese. Low-fat or nonfat yogurt. Low-fat, low-sodium cheese. Fats and oils Soft margarine without trans fats. Vegetable oil. Low-fat, reduced-fat, or light mayonnaise and salad dressings (reduced-sodium). Canola, safflower, olive, soybean, and sunflower oils. Avocado. Seasoning and other foods Herbs. Spices. Seasoning mixes without salt. Unsalted popcorn and pretzels. Fat-free sweets. What foods are not recommended? The items listed may not be a complete list. Talk with your dietitian about what dietary choices are best for you. Grains Baked goods made with fat, such as croissants, muffins, or some breads. Dry pasta or rice meal packs. Vegetables Creamed or fried vegetables. Vegetables in a cheese sauce. Regular canned vegetables (not low-sodium or reduced-sodium). Regular canned tomato sauce and paste (not low-sodium or reduced-sodium). Regular tomato and vegetable juice (not low-sodium or reduced-sodium). Pickles. Olives. Fruits Canned fruit in a light or heavy syrup. Fried fruit. Fruit in cream or butter sauce. Meat and other protein foods Fatty cuts of meat. Ribs. Fried meat. Bacon. Sausage. Bologna and other processed lunch meats. Salami. Fatback. Hotdogs. Bratwurst. Salted nuts and seeds. Canned beans with added salt. Canned or smoked fish. Whole eggs or egg yolks. Chicken or turkey with skin. Dairy Whole or 2% milk, cream, and half-and-half. Whole or full-fat cream cheese. Whole-fat or sweetened yogurt. Full-fat cheese. Nondairy creamers. Whipped toppings.  Processed cheese and cheese spreads. Fats and oils Butter. Stick margarine. Lard. Shortening. Ghee. Bacon fat. Tropical oils, such as coconut, palm kernel, or palm oil. Seasoning and other foods Salted popcorn and pretzels. Onion salt, garlic salt, seasoned salt, table salt, and sea salt. Worcestershire sauce. Tartar sauce. Barbecue sauce. Teriyaki sauce. Soy sauce, including reduced-sodium. Steak sauce. Canned and packaged gravies. Fish sauce. Oyster sauce. Cocktail sauce. Horseradish that you find on the shelf. Ketchup. Mustard. Meat flavorings and tenderizers. Bouillon cubes. Hot sauce and Tabasco sauce. Premade or packaged marinades. Premade or packaged taco seasonings. Relishes. Regular salad dressings. Where to find more information:  National Heart, Lung, and Blood Institute: www.nhlbi.nih.gov  American Heart Association: www.heart.org Summary  The DASH eating plan is a healthy eating plan that has been shown to reduce high blood pressure (hypertension). It may also reduce your risk for type 2 diabetes, heart disease, and stroke.  With the DASH eating plan, you should limit salt (sodium) intake to 2,300 mg a day. If you have hypertension, you may need to reduce your sodium intake to 1,500 mg a day.  When on the DASH eating plan, aim to eat more fresh fruits and vegetables, whole grains, lean proteins, low-fat dairy, and heart-healthy fats.  Work with your health care provider or diet and nutrition specialist (dietitian) to adjust your eating plan to your individual   calorie needs. This information is not intended to replace advice given to you by your health care provider. Make sure you discuss any questions you have with your health care provider. Document Released: 07/11/2011 Document Revised: 07/15/2016 Document Reviewed: 07/15/2016 Elsevier Interactive Patient Education  2017 [employer]     Ingrown Hair An ingrown hair is a hair that curls and re-enters the skin instead of  growing straight out of the skin. An ingrown hair can develop in any part of the skin that hair is removed from. An ingrown hair may cause small pockets of infection. What are the causes? An ingrown hair can be caused by:  Shaving.  Tweezing.  Waxing.  Using a hair removal cream.  What increases the risk? Ingrown hairs are more likely to develop in people who have curly hair. What are the signs or symptoms? Symptoms of an ingrown hair may include:  Small bumps on the skin. The bumps may be filled with pus.  Pain.  Itching.  How is this diagnosed? An ingrown hair is diagnosed with a skin exam. How is this treated? Treatment is often not needed unless the ingrown hair has caused an infection. Treatment may involve:  Applying prescription creams to the skin. This can help with inflammation.  Applying warm compresses to the skin. This can help soften the skin.  Taking antibiotic medicine. An antibiotic may be prescribed if the infection is severe.  Follow these instructions at home:  Do not shave irritated areas of skin. You may start shaving these areas again once the irritation has gone away.  Take, apply, or use over-the-counter and prescription medicines only as told by your health care provider. This includes any prescription creams.  If you were prescribed an antibiotic medicine, take it as told by your health care provider. Do not stop taking the antibiotic even if your condition improves.  To help remove ingrown hairs on your face, you may use a facial sponge in a gentle circular motion.  If directed, apply heat to the affected area. Use the heat source that your health care provider recommends, such as a moist heat pack or a heating pad. ? Place a towel between your skin and the heat source. ? Leave the heat on for 20-30 minutes. ? Remove the heat if your skin turns bright red. This is especially important if you are unable to feel pain, heat, or cold. You may have  a greater risk of getting burned. How is this prevented?  Shower before shaving.  Wrap areas that you are going to shave in warm, moist wraps for several minutes before shaving. The warmth and moisture helps to soften the hairs and makes ingrown hairs less likely.  Use thick shaving gels.  Use a razor that cuts hair slightly above your skin. Or, use an electric shaver with a long shave setting.  Shave in the direction of hair growth.  Avoid making multiple razor strokes.  Apply a moisturizing lotion after shaving. This information is not intended to replace advice given to you by your health care provider. Make sure you discuss any questions you have with your health care provider. Document Released: 10/28/2000 Document Revised: 02/09/2016 Document Reviewed: 05/12/2015 Elsevier Interactive Patient Education  2018 [employer].

## 2017-04-22 NOTE — Progress Notes (Signed)
BP 140/90 (BP Location: Left Arm, Patient Position: Sitting, Cuff Size: Normal)   Pulse 62   Temp 97.7 F (36.5 C)   Ht  (1.778 m)   Wt 164 lb 12 oz (74.7 kg)   SpO2 97%   BMI 23.64 kg/m    Subjective:    Patient ID: Melvin Hernandez, male    DOB: [birth year], 46 y.o.   MRN: [id number]  HPI: Melvin Hernandez is a 46 y.o. male presenting on 04/22/2017 for Hypertension   HPI   Pt still did not get labs drawn  He is feeling well today.  He has been picking at his face.  He says he gets ingrown hairs because they curl around and he picks at them.   Relevant past medical, surgical, family and social history reviewed and updated as indicated. Interim medical history since our last visit reviewed. Allergies and medications reviewed and updated.   Current Outpatient Prescriptions:  .  amLODipine (NORVASC) 10 MG tablet, Take 1 tablet (10 mg total) by mouth daily., Disp: 30 tablet, Rfl: 1 .  chlorthalidone (HYGROTON) 25 MG tablet, Take 0.5 tablets (12.5 mg total) by mouth daily., Disp: 30 tablet, Rfl: 0 .  lisinopril (PRINIVIL,ZESTRIL) 20 MG tablet, Take 1 tablet (20 mg total) by mouth 2 (two) times daily., Disp: 60 tablet, Rfl: 0 .  nitroGLYCERIN (NITROSTAT) 0.4 MG SL tablet, Place 1 tablet (0.4 mg total) under the tongue every 5 (five) minutes as needed for chest pain., Disp: 25 tablet, Rfl: 3   Review of Systems  Constitutional: Negative for appetite change, chills, diaphoresis, fatigue, fever and unexpected weight change.  HENT: Negative for congestion, dental problem, drooling, ear pain, facial swelling, hearing loss, mouth sores, sneezing, sore throat, trouble swallowing and voice change.   Eyes: Negative for pain, discharge, redness, itching and visual disturbance.  Respiratory: Negative for cough, choking, shortness of breath and wheezing.   Cardiovascular: Negative for chest pain, palpitations and leg swelling.  Gastrointestinal: Negative for abdominal pain, blood in stool,  constipation, diarrhea and vomiting.  Endocrine: Negative for cold intolerance, heat intolerance and polydipsia.  Genitourinary: Negative for decreased urine volume, dysuria and hematuria.  Musculoskeletal: Negative for arthralgias, back pain and gait problem.  Skin: Negative for rash.  Allergic/Immunologic: Negative for environmental allergies.  Neurological: Negative for seizures, syncope, light-headedness and headaches.  Hematological: Negative for adenopathy.  Psychiatric/Behavioral: Negative for agitation, dysphoric mood and suicidal ideas. The patient is not nervous/anxious.     Per HPI unless specifically indicated above     Objective:    BP 140/90 (BP Location: Left Arm, Patient Position: Sitting, Cuff Size: Normal)   Pulse 62   Temp 97.7 F (36.5 C)   Ht  (1.778 m)   Wt 164 lb 12 oz (74.7 kg)   SpO2 97%   BMI 23.64 kg/m   Wt Readings from Last 3 Encounters:  04/22/17 164 lb 12 oz (74.7 kg)  02/25/17 162 lb (73.5 kg)  01/06/17 168 lb 4 oz (76.3 kg)    Physical Exam  Constitutional: He is oriented to person, place, and time. He appears well-developed and well-nourished.  HENT:  Head: Normocephalic and atraumatic.  Neck: Neck supple.  Cardiovascular: Normal rate and regular rhythm.   Pulmonary/Chest: Effort normal and breath sounds normal. He has no wheezes.  Abdominal: Soft. Bowel sounds are normal. There is no hepatosplenomegaly. There is no tenderness.  Musculoskeletal: He exhibits no edema.  Lymphadenopathy:    He has no cervical  adenopathy.  Neurological: He is alert and oriented to person, place, and time.  Skin: Skin is warm and dry.  3 areas of excoriation on L face & neck.  They appear to be dug out circles.  There are 3 large pustules present which are inflamed and tender.  No abscess.   Psychiatric: He has a normal mood and affect. His behavior is normal.  Vitals reviewed.       Assessment & Plan:    Encounter Diagnoses  Name Primary?  .  Essential hypertension, benign Yes  . Cigarette nicotine dependence, uncomplicated   . Hair follicle infection   . Ingrowing hair   . Personal history of noncompliance with medical treatment, presenting hazards to health     -pt urged to Get labs drawn when he leaves the office today -pt counseled to Quit picking at face bumps.  rx keflex and gave handout.  Encouraged him to use warm compress instead of picking -pt counseled on Low sodium diet and given handout.  Will not increase medication today in hopes that sodium restriction will help -pt to follow up in 1 month to recheck bp.  RTO sooner prn

## 2017-05-27 ENCOUNTER — Ambulatory Visit: Payer: Self-pay | Admitting: Physician Assistant

## 2017-06-03 ENCOUNTER — Ambulatory Visit: Payer: Self-pay | Admitting: Physician Assistant

## 2017-06-10 ENCOUNTER — Other Ambulatory Visit (HOSPITAL_COMMUNITY)
Admission: RE | Admit: 2017-06-10 | Discharge: 2017-06-10 | Disposition: A | Discharge status: Home / Self Care | Payer: Self-pay | Source: Ambulatory Visit | Attending: Physician Assistant | Admitting: Physician Assistant

## 2017-06-10 DIAGNOSIS — I10 Essential (primary) hypertension: Secondary | ICD-10-CM | POA: Insufficient documentation

## 2017-06-10 LAB — BASIC METABOLIC PANEL
Anion gap: 10 (ref 5–15)
BUN: 14 mg/dL (ref 6–20)
CALCIUM: 9.6 mg/dL (ref 8.9–10.3)
CO2: 26 mmol/L (ref 22–32)
Chloride: 103 mmol/L (ref 101–111)
Creatinine, Ser: 1.23 mg/dL (ref 0.61–1.24)
GFR calc Af Amer: >60 mL/min (ref >60)
GLUCOSE: 92 mg/dL (ref 65–99)
Potassium: 3.6 mmol/L (ref 3.5–5.1)
Sodium: 139 mmol/L (ref 135–145)

## 2017-06-11 ENCOUNTER — Encounter: Payer: Self-pay | Admitting: Physician Assistant

## 2017-06-11 ENCOUNTER — Ambulatory Visit: Payer: Self-pay | Admitting: Physician Assistant

## 2017-06-11 VITALS — BP 154/86 | HR 76 | Temp 97.9°F | Ht 70.0 in | Wt 162.5 lb

## 2017-06-11 DIAGNOSIS — I10 Essential (primary) hypertension: Secondary | ICD-10-CM

## 2017-06-11 DIAGNOSIS — F17210 Nicotine dependence, cigarettes, uncomplicated: Secondary | ICD-10-CM

## 2017-06-11 NOTE — Patient Instructions (Signed)
DASH Eating Plan DASH stands for "Dietary Approaches to Stop Hypertension." The DASH eating plan is a healthy eating plan that has been shown to reduce high blood pressure (hypertension). It may also reduce your risk for type 2 diabetes, heart disease, and stroke. The DASH eating plan may also help with weight loss. What are tips for following this plan? General guidelines  Avoid eating more than 2,300 mg (milligrams) of salt (sodium) a day. If you have hypertension, you may need to reduce your sodium intake to 1,500 mg a day.  Limit alcohol intake to no more than 1 drink a day for nonpregnant women and 2 drinks a day for men. One drink equals 12 oz of beer, 5 oz of wine, or 1 oz of hard liquor.  Work with your health care provider to maintain a healthy body weight or to lose weight. Ask what an ideal weight is for you.  Get at least 30 minutes of exercise that causes your heart to beat faster (aerobic exercise) most days of the week. Activities may include walking, swimming, or biking.  Work with your health care provider or diet and nutrition specialist (dietitian) to adjust your eating plan to your individual calorie needs. Reading food labels  Check food labels for the amount of sodium per serving. Choose foods with less than 5 percent of the Daily Value of sodium. Generally, foods with less than 300 mg of sodium per serving fit into this eating plan.  To find whole grains, look for the word "whole" as the first word in the ingredient list. Shopping  Buy products labeled as "low-sodium" or "no salt added."  Buy fresh foods. Avoid canned foods and premade or frozen meals. Cooking  Avoid adding salt when cooking. Use salt-free seasonings or herbs instead of table salt or sea salt. Check with your health care provider or pharmacist before using salt substitutes.  Do not fry foods. Cook foods using healthy methods such as baking, boiling, grilling, and broiling instead.  Cook with  heart-healthy oils, such as olive, canola, soybean, or sunflower oil. Meal planning   Eat a balanced diet that includes: ? 5 or more servings of fruits and vegetables each day. At each meal, try to fill half of your plate with fruits and vegetables. ? Up to 6-8 servings of whole grains each day. ? Less than 6 oz of lean meat, poultry, or fish each day. A 3-oz serving of meat is about the same size as a deck of cards. One egg equals 1 oz. ? 2 servings of low-fat dairy each day. ? A serving of nuts, seeds, or beans 5 times each week. ? Heart-healthy fats. Healthy fats called Omega-3 fatty acids are found in foods such as flaxseeds and coldwater fish, like sardines, salmon, and mackerel.  Limit how much you eat of the following: ? Canned or prepackaged foods. ? Food that is high in trans fat, such as fried foods. ? Food that is high in saturated fat, such as fatty meat. ? Sweets, desserts, sugary drinks, and other foods with added sugar. ? Full-fat dairy products.  Do not salt foods before eating.  Try to eat at least 2 vegetarian meals each week.  Eat more home-cooked food and less restaurant, buffet, and fast food.  When eating at a restaurant, ask that your food be prepared with less salt or no salt, if possible. What foods are recommended? The items listed may not be a complete list. Talk with your dietitian about what   dietary choices are best for you. Grains Whole-grain or whole-wheat bread. Whole-grain or whole-wheat pasta. Brown rice. Oatmeal. Quinoa. Bulgur. Whole-grain and low-sodium cereals. Pita bread. Low-fat, low-sodium crackers. Whole-wheat flour tortillas. Vegetables Fresh or frozen vegetables (raw, steamed, roasted, or grilled). Low-sodium or reduced-sodium tomato and vegetable juice. Low-sodium or reduced-sodium tomato sauce and tomato paste. Low-sodium or reduced-sodium canned vegetables. Fruits All fresh, dried, or frozen fruit. Canned fruit in natural juice (without  added sugar). Meat and other protein foods Skinless chicken or turkey. Ground chicken or turkey. Pork with fat trimmed off. Fish and seafood. Egg whites. Dried beans, peas, or lentils. Unsalted nuts, nut butters, and seeds. Unsalted canned beans. Lean cuts of beef with fat trimmed off. Low-sodium, lean deli meat. Dairy Low-fat (1%) or fat-free (skim) milk. Fat-free, low-fat, or reduced-fat cheeses. Nonfat, low-sodium ricotta or cottage cheese. Low-fat or nonfat yogurt. Low-fat, low-sodium cheese. Fats and oils Soft margarine without trans fats. Vegetable oil. Low-fat, reduced-fat, or light mayonnaise and salad dressings (reduced-sodium). Canola, safflower, olive, soybean, and sunflower oils. Avocado. Seasoning and other foods Herbs. Spices. Seasoning mixes without salt. Unsalted popcorn and pretzels. Fat-free sweets. What foods are not recommended? The items listed may not be a complete list. Talk with your dietitian about what dietary choices are best for you. Grains Baked goods made with fat, such as croissants, muffins, or some breads. Dry pasta or rice meal packs. Vegetables Creamed or fried vegetables. Vegetables in a cheese sauce. Regular canned vegetables (not low-sodium or reduced-sodium). Regular canned tomato sauce and paste (not low-sodium or reduced-sodium). Regular tomato and vegetable juice (not low-sodium or reduced-sodium). Pickles. Olives. Fruits Canned fruit in a light or heavy syrup. Fried fruit. Fruit in cream or butter sauce. Meat and other protein foods Fatty cuts of meat. Ribs. Fried meat. Bacon. Sausage. Bologna and other processed lunch meats. Salami. Fatback. Hotdogs. Bratwurst. Salted nuts and seeds. Canned beans with added salt. Canned or smoked fish. Whole eggs or egg yolks. Chicken or turkey with skin. Dairy Whole or 2% milk, cream, and half-and-half. Whole or full-fat cream cheese. Whole-fat or sweetened yogurt. Full-fat cheese. Nondairy creamers. Whipped toppings.  Processed cheese and cheese spreads. Fats and oils Butter. Stick margarine. Lard. Shortening. Ghee. Bacon fat. Tropical oils, such as coconut, palm kernel, or palm oil. Seasoning and other foods Salted popcorn and pretzels. Onion salt, garlic salt, seasoned salt, table salt, and sea salt. Worcestershire sauce. Tartar sauce. Barbecue sauce. Teriyaki sauce. Soy sauce, including reduced-sodium. Steak sauce. Canned and packaged gravies. Fish sauce. Oyster sauce. Cocktail sauce. Horseradish that you find on the shelf. Ketchup. Mustard. Meat flavorings and tenderizers. Bouillon cubes. Hot sauce and Tabasco sauce. Premade or packaged marinades. Premade or packaged taco seasonings. Relishes. Regular salad dressings. Where to find more information:  National Heart, Lung, and Blood Institute: www.nhlbi.nih.gov  American Heart Association: www.heart.org Summary  The DASH eating plan is a healthy eating plan that has been shown to reduce high blood pressure (hypertension). It may also reduce your risk for type 2 diabetes, heart disease, and stroke.  With the DASH eating plan, you should limit salt (sodium) intake to 2,300 mg a day. If you have hypertension, you may need to reduce your sodium intake to 1,500 mg a day.  When on the DASH eating plan, aim to eat more fresh fruits and vegetables, whole grains, lean proteins, low-fat dairy, and heart-healthy fats.  Work with your health care provider or diet and nutrition specialist (dietitian) to adjust your eating plan to your individual   calorie needs. This information is not intended to replace advice given to you by your health care provider. Make sure you discuss any questions you have with your health care provider. Document Released: 07/11/2011 Document Revised: 07/15/2016 Document Reviewed: 07/15/2016 Elsevier Interactive Patient Education  2017 [employer]  

## 2017-06-11 NOTE — Progress Notes (Signed)
BP (!) 160/86 (BP Location: Left Arm, Patient Position: Sitting, Cuff Size: Normal)   Pulse 76   Temp 97.9 F (36.6 C)   Ht 5\' 10"  (1.778 m)   Wt 162 lb 8 oz (73.7 kg)   SpO2 97%   BMI 23.32 kg/m    Subjective:    Patient ID: Melvin Hernandez, male    DOB: 1971/06/29, 46 y.o.   MRN: [id number]  HPI: Melvin Hernandez is a 46 y.o. male presenting on 06/11/2017 for Hypertension   HPI   Pt has not been following low sodium diet as discussed at last OV.   Pt says his BP is high today because he was drinking last night.  Had 3 beer and 4 or 5 shots of liquor.    Pt has not taken his medications yet this morning because he was running late  Relevant past medical, surgical, family and social history reviewed and updated as indicated. Interim medical history since our last visit reviewed. Allergies and medications reviewed and updated.   Current Outpatient Medications:  .  amLODipine (NORVASC) 10 MG tablet, Take 1 tablet (10 mg total) by mouth daily., Disp: 30 tablet, Rfl: 1 .  chlorthalidone (HYGROTON) 25 MG tablet, Take 0.5 tablets (12.5 mg total) by mouth daily., Disp: 30 tablet, Rfl: 0 .  lisinopril (PRINIVIL,ZESTRIL) 20 MG tablet, Take 1 tablet (20 mg total) by mouth 2 (two) times daily., Disp: 60 tablet, Rfl: 0 .  nitroGLYCERIN (NITROSTAT) 0.4 MG SL tablet, Place 1 tablet (0.4 mg total) under the tongue every 5 (five) minutes as needed for chest pain., Disp: 25 tablet, Rfl: 3   Review of Systems  Constitutional: Negative for appetite change, chills, diaphoresis, fatigue, fever and unexpected weight change.  HENT: Positive for dental problem. Negative for congestion, drooling, ear pain, facial swelling, hearing loss, mouth sores, sneezing, sore throat, trouble swallowing and voice change.   Eyes: Negative for pain, discharge, redness, itching and visual disturbance.  Respiratory: Negative for cough, choking, shortness of breath and wheezing.   Cardiovascular: Negative for chest pain,  palpitations and leg swelling.  Gastrointestinal: Negative for abdominal pain, blood in stool, constipation, diarrhea and vomiting.  Endocrine: Negative for cold intolerance, heat intolerance and polydipsia.  Genitourinary: Negative for decreased urine volume, dysuria and hematuria.  Musculoskeletal: Negative for arthralgias, back pain and gait problem.  Skin: Negative for rash.  Allergic/Immunologic: Negative for environmental allergies.  Neurological: Negative for seizures, syncope, light-headedness and headaches.  Hematological: Negative for adenopathy.  Psychiatric/Behavioral: Negative for agitation, dysphoric mood and suicidal ideas. The patient is not nervous/anxious.     Per HPI unless specifically indicated above     Objective:    BP (!) 160/86 (BP Location: Left Arm, Patient Position: Sitting, Cuff Size: Normal)   Pulse 76   Temp 97.9 F (36.6 C)   Ht 5\' 10"  (1.778 m)   Wt 162 lb 8 oz (73.7 kg)   SpO2 97%   BMI 23.32 kg/m   Wt Readings from Last 3 Encounters:  06/11/17 162 lb 8 oz (73.7 kg)  04/22/17 164 lb 12 oz (74.7 kg)  02/25/17 162 lb (73.5 kg)    Physical Exam  Constitutional: He is oriented to person, place, and time. He appears well-developed and well-nourished.  HENT:  Head: Normocephalic and atraumatic.  Neck: Neck supple.  Cardiovascular: Normal rate and regular rhythm.  Pulmonary/Chest: Effort normal and breath sounds normal. He has no wheezes.  Abdominal: Soft. Bowel sounds are normal. There is  no hepatosplenomegaly. There is no tenderness.  Musculoskeletal: He exhibits no edema.  Lymphadenopathy:    He has no cervical adenopathy.  Neurological: He is alert and oriented to person, place, and time.  Skin: Skin is warm and dry.  Psychiatric: He has a normal mood and affect. His behavior is normal.  Vitals reviewed.   Results for orders placed or performed during the hospital encounter of 06/10/17  Basic Metabolic Panel (BMET)  Result Value Ref  Range   Sodium 139 135 - 145 mmol/L   Potassium 3.6 3.5 - 5.1 mmol/L   Chloride 103 101 - 111 mmol/L   CO2 26 22 - 32 mmol/L   Glucose, Bld 92 65 - 99 mg/dL   BUN 14 6 - 20 mg/dL   Creatinine, Ser 4.091.23 0.61 - 1.24 mg/dL   Calcium 9.6 8.9 - 81.110.3 mg/dL   GFR calc non Af Amer >60 >60 mL/min   GFR calc Af Amer >60 >60 mL/min   Anion gap 10 5 - 15      Assessment & Plan:   Encounter Diagnoses  Name Primary?  . Essential hypertension, benign Yes  . Cigarette nicotine dependence, uncomplicated     -reviewed labs with pt -Pt counseled to take meds regularly. -Discussed drinking only in moderation -Reviewed low sodium diet again and gave another handout -pt to Follow up 1 month to recheck bp. RTO sooner prn

## 2017-07-16 ENCOUNTER — Ambulatory Visit: Payer: Self-pay | Admitting: Physician Assistant

## 2017-07-24 ENCOUNTER — Encounter: Payer: Self-pay | Admitting: Physician Assistant

## 2017-07-24 ENCOUNTER — Ambulatory Visit: Payer: Self-pay | Admitting: Physician Assistant

## 2017-07-24 VITALS — BP 150/86 | HR 68 | Temp 97.9°F | Ht 70.0 in | Wt 160.0 lb

## 2017-07-24 DIAGNOSIS — I10 Essential (primary) hypertension: Secondary | ICD-10-CM

## 2017-07-24 NOTE — Progress Notes (Signed)
BP (!) 150/86 (BP Location: Left Arm, Patient Position: Sitting, Cuff Size: Normal)   Pulse 68   Temp 97.9 F (36.6 C)   Ht 5\' 10"  (1.778 m)   Wt 160 lb (72.6 kg)   SpO2 98%   BMI 22.96 kg/m    Subjective:    Patient ID: Melvin Hernandez Angelucci, male    DOB: [birth year], [age] y.o.   MRN: [id number]  HPI: Melvin Hernandez Radu is a 46 y.o. male presenting on 07/24/2017 for Hypertension   HPI   Pt without complaints.  He says he occasionally forgets his second dose of lisinopril.  He says he does have a lot of stress in his life.  He has cut back on drinking and now only smokes about one cigar a week.  Relevant past medical, surgical, family and social history reviewed and updated as indicated. Interim medical history since our last visit reviewed. Allergies and medications reviewed and updated.   Current Outpatient Medications:  .  amLODipine (NORVASC) 10 MG tablet, Take 1 tablet (10 mg total) by mouth daily., Disp: 30 tablet, Rfl: 1 .  chlorthalidone (HYGROTON) 25 MG tablet, Take 0.5 tablets (12.5 mg total) by mouth daily., Disp: 30 tablet, Rfl: 0 .  lisinopril (PRINIVIL,ZESTRIL) 20 MG tablet, Take 1 tablet (20 mg total) by mouth 2 (two) times daily., Disp: 60 tablet, Rfl: 0 .  nitroGLYCERIN (NITROSTAT) 0.4 MG SL tablet, Place 1 tablet (0.4 mg total) under the tongue every 5 (five) minutes as needed for chest pain., Disp: 25 tablet, Rfl: 3   Review of Systems  Constitutional: Negative for appetite change, chills, diaphoresis, fatigue, fever and unexpected weight change.  HENT: Positive for dental problem. Negative for congestion, drooling, ear pain, facial swelling, hearing loss, mouth sores, sneezing, sore throat, trouble swallowing and voice change.   Eyes: Negative for pain, discharge, redness, itching and visual disturbance.  Respiratory: Negative for cough, choking, shortness of breath and wheezing.   Cardiovascular: Negative for chest pain, palpitations and leg swelling.  Gastrointestinal:  Negative for abdominal pain, blood in stool, constipation, diarrhea and vomiting.  Endocrine: Negative for cold intolerance, heat intolerance and polydipsia.  Genitourinary: Negative for decreased urine volume, dysuria and hematuria.  Musculoskeletal: Negative for arthralgias, back pain and gait problem.  Skin: Negative for rash.  Allergic/Immunologic: Negative for environmental allergies.  Neurological: Negative for seizures, syncope, light-headedness and headaches.  Hematological: Negative for adenopathy.  Psychiatric/Behavioral: Negative for agitation, dysphoric mood and suicidal ideas. The patient is not nervous/anxious.     Per HPI unless specifically indicated above     Objective:    BP (!) 150/86 (BP Location: Left Arm, Patient Position: Sitting, Cuff Size: Normal)   Pulse 68   Temp 97.9 F (36.6 C)   Ht 5\' 10"  (1.778 m)   Wt 160 lb (72.6 kg)   SpO2 98%   BMI 22.96 kg/m   Wt Readings from Last 3 Encounters:  07/24/17 160 lb (72.6 kg)  06/11/17 162 lb 8 oz (73.7 kg)  04/22/17 164 lb 12 oz (74.7 kg)    Physical Exam  Constitutional: He is oriented to person, place, and time. He appears well-developed and well-nourished.  HENT:  Head: Normocephalic and atraumatic.  Neck: Neck supple.  Cardiovascular: Normal rate and regular rhythm.  Pulmonary/Chest: Effort normal and breath sounds normal. He has no wheezes.  Abdominal: Soft. Bowel sounds are normal. There is no hepatosplenomegaly. There is no tenderness.  Musculoskeletal: He exhibits no edema.  Lymphadenopathy:  He has no cervical adenopathy.  Neurological: He is alert and oriented to person, place, and time.  Skin: Skin is warm and dry.  Psychiatric: He has a normal mood and affect. His behavior is normal.  Vitals reviewed.       Assessment & Plan:   Encounter Diagnosis  Name Primary?  . Essential hypertension, benign Yes    -no changes to medication -discussed strategies to help avoid missed medication  doses -discussed methods to help deal with stress better -pt to follow up in 6 weeks to recheck bp.  Pt to RTO sooner prn

## 2017-07-24 NOTE — Patient Instructions (Signed)
Stress and Stress Management Stress is a normal reaction to life events. It is what you feel when life demands more than you are used to or more than you can handle. Some stress can be useful. For example, the stress reaction can help you catch the last bus of the day, study for a test, or meet a deadline at work. But stress that occurs too often or for too long can cause problems. It can affect your emotional health and interfere with relationships and normal daily activities. Too much stress can weaken your immune system and increase your risk for physical illness. If you already have a medical problem, stress can make it worse. What are the causes? All sorts of life events may cause stress. An event that causes stress for one person may not be stressful for another person. Major life events commonly cause stress. These may be positive or negative. Examples include losing your job, moving into a new home, getting married, having a baby, or losing a loved one. Less obvious life events may also cause stress, especially if they occur day after day or in combination. Examples include working long hours, driving in traffic, caring for children, being in debt, or being in a difficult relationship. What are the signs or symptoms? Stress may cause emotional symptoms including, the following:  Anxiety. This is feeling worried, afraid, on edge, overwhelmed, or out of control.  Anger. This is feeling irritated or impatient.  Depression. This is feeling sad, down, helpless, or guilty.  Difficulty focusing, remembering, or making decisions.  Stress may cause physical symptoms, including the following:  Aches and pains. These may affect your head, neck, back, stomach, or other areas of your body.  Tight muscles or clenched jaw.  Low energy or trouble sleeping.  Stress may cause unhealthy behaviors, including the following:  Eating to feel better (overeating) or skipping meals.  Sleeping too little,  too much, or both.  Working too much or putting off tasks (procrastination).  Smoking, drinking alcohol, or using drugs to feel better.  How is this diagnosed? Stress is diagnosed through an assessment by your health care provider. Your health care provider will ask questions about your symptoms and any stressful life events.Your health care provider will also ask about your medical history and may order blood tests or other tests. Certain medical conditions and medicine can cause physical symptoms similar to stress. Mental illness can cause emotional symptoms and unhealthy behaviors similar to stress. Your health care provider may refer you to a mental health professional for further evaluation. How is this treated? Stress management is the recommended treatment for stress.The goals of stress management are reducing stressful life events and coping with stress in healthy ways. Techniques for reducing stressful life events include the following:  Stress identification. Self-monitor for stress and identify what causes stress for you. These skills may help you to avoid some stressful events.  Time management. Set your priorities, keep a calendar of events, and learn to say "no." These tools can help you avoid making too many commitments.  Techniques for coping with stress include the following:  Rethinking the problem. Try to think realistically about stressful events rather than ignoring them or overreacting. Try to find the positives in a stressful situation rather than focusing on the negatives.  Exercise. Physical exercise can release both physical and emotional tension. The key is to find a form of exercise you enjoy and do it regularly.  Relaxation techniques. These relax the body and  mind. Examples include yoga, meditation, tai chi, biofeedback, deep breathing, progressive muscle relaxation, listening to music, being out in nature, journaling, and other hobbies. Again, the key is to find  one or more that you enjoy and can do regularly.  Healthy lifestyle. Eat a balanced diet, get plenty of sleep, and do not smoke. Avoid using alcohol or drugs to relax.  Strong support network. Spend time with family, friends, or other people you enjoy being around.Express your feelings and talk things over with someone you trust.  Counseling or talktherapy with a mental health professional may be helpful if you are having difficulty managing stress on your own. Medicine is typically not recommended for the treatment of stress.Talk to your health care provider if you think you need medicine for symptoms of stress. Follow these instructions at home:  Keep all follow-up visits as directed by your health care provider.  Take all medicines as directed by your health care provider. Contact a health care provider if:  Your symptoms get worse or you start having new symptoms.  You feel overwhelmed by your problems and can no longer manage them on your own. Get help right away if:  You feel like hurting yourself or someone else. This information is not intended to replace advice given to you by your health care provider. Make sure you discuss any questions you have with your health care provider. Document Released: 01/15/2001 Document Revised: 12/28/2015 Document Reviewed: 03/16/2013 Elsevier Interactive Patient Education  2017 [employer]  

## 2017-09-04 ENCOUNTER — Encounter: Payer: Self-pay | Admitting: Physician Assistant

## 2017-09-04 ENCOUNTER — Ambulatory Visit: Payer: Self-pay | Admitting: Physician Assistant

## 2017-09-04 VITALS — BP 119/74 | HR 69 | Temp 97.0°F | Ht 70.0 in | Wt 158.0 lb

## 2017-09-04 DIAGNOSIS — I10 Essential (primary) hypertension: Secondary | ICD-10-CM

## 2017-09-04 DIAGNOSIS — F17210 Nicotine dependence, cigarettes, uncomplicated: Secondary | ICD-10-CM

## 2017-09-04 DIAGNOSIS — Z13220 Encounter for screening for lipoid disorders: Secondary | ICD-10-CM

## 2017-09-04 NOTE — Progress Notes (Signed)
BP 119/74 (BP Location: Right Arm, Patient Position: Sitting, Cuff Size: Normal)   Pulse 69   Temp (!) 97 F (36.1 C) (Other (Comment))   Ht 5\' 10"  (1.778 m)   Wt 158 lb (71.7 kg)   SpO2 99%   BMI 22.67 kg/m    Subjective:    Patient ID: Melvin Hernandez, male    DOB: [birth year], 47 y.o.   MRN: [id number]  HPI: Melvin Hernandez is a 47 y.o. male presenting on 09/04/2017 for Hypertension   HPI Pt is feeling well today.  He requests dental care/referral. He is not taking the amlodipine but his bp is good today.   Relevant past medical, surgical, family and social history reviewed and updated as indicated. Interim medical history since our last visit reviewed. Allergies and medications reviewed and updated.   Current Outpatient Medications:  .  chlorthalidone (HYGROTON) 25 MG tablet, Take 0.5 tablets (12.5 mg total) by mouth daily., Disp: 30 tablet, Rfl: 0 .  lisinopril (PRINIVIL,ZESTRIL) 20 MG tablet, Take 1 tablet (20 mg total) by mouth 2 (two) times daily., Disp: 60 tablet, Rfl: 0 .  nitroGLYCERIN (NITROSTAT) 0.4 MG SL tablet, Place 1 tablet (0.4 mg total) under the tongue every 5 (five) minutes as needed for chest pain., Disp: 25 tablet, Rfl: 3 .  amLODipine (NORVASC) 10 MG tablet, Take 1 tablet (10 mg total) by mouth daily. (Patient not taking: Reported on 09/04/2017), Disp: 30 tablet, Rfl: 1   Review of Systems  Constitutional: Negative for appetite change, chills, diaphoresis, fatigue, fever and unexpected weight change.  HENT: Positive for dental problem. Negative for congestion, drooling, ear pain, facial swelling, hearing loss, mouth sores, sneezing, sore throat, trouble swallowing and voice change.   Eyes: Negative for pain, discharge, redness, itching and visual disturbance.  Respiratory: Negative for cough, choking, shortness of breath and wheezing.   Cardiovascular: Negative for chest pain, palpitations and leg swelling.  Gastrointestinal: Negative for abdominal pain, blood  in stool, constipation, diarrhea and vomiting.  Endocrine: Negative for cold intolerance, heat intolerance and polydipsia.  Genitourinary: Negative for decreased urine volume, dysuria and hematuria.  Musculoskeletal: Negative for arthralgias, back pain and gait problem.  Skin: Negative for rash.  Allergic/Immunologic: Negative for environmental allergies.  Neurological: Negative for seizures, syncope, light-headedness and headaches.  Hematological: Negative for adenopathy.  Psychiatric/Behavioral: Negative for agitation, dysphoric mood and suicidal ideas. The patient is not nervous/anxious.     Per HPI unless specifically indicated above     Objective:    BP 119/74 (BP Location: Right Arm, Patient Position: Sitting, Cuff Size: Normal)   Pulse 69   Temp (!) 97 F (36.1 C) (Other (Comment))   Ht 5\' 10"  (1.778 m)   Wt 158 lb (71.7 kg)   SpO2 99%   BMI 22.67 kg/m   Wt Readings from Last 3 Encounters:  09/04/17 158 lb (71.7 kg)  07/24/17 160 lb (72.6 kg)  06/11/17 162 lb 8 oz (73.7 kg)    Physical Exam  Constitutional: He is oriented to person, place, and time. He appears well-developed and well-nourished.  HENT:  Head: Normocephalic and atraumatic.  Neck: Neck supple.  Cardiovascular: Normal rate and regular rhythm.  Pulmonary/Chest: Effort normal and breath sounds normal. He has no wheezes.  Abdominal: Soft. Bowel sounds are normal. There is no hepatosplenomegaly. There is no tenderness.  Musculoskeletal: He exhibits no edema.  Lymphadenopathy:    He has no cervical adenopathy.  Neurological: He is alert and oriented to person,  place, and time.  Skin: Skin is warm and dry.  Psychiatric: He has a normal mood and affect. His behavior is normal.  Vitals reviewed.       Assessment & Plan:    Encounter Diagnoses  Name Primary?  . Essential hypertension, benign Yes  . Cigarette nicotine dependence, uncomplicated   . Screening cholesterol level     -pt due for labs.   He will get those tomorrow and we will call him with results -pt will be put on dental list -pt to continue current medications -pt to follow up 3 months. RTO sooner prn

## 2017-12-02 ENCOUNTER — Ambulatory Visit: Payer: Self-pay | Admitting: Physician Assistant

## 2017-12-09 ENCOUNTER — Ambulatory Visit: Payer: Self-pay | Admitting: Physician Assistant

## 2017-12-17 ENCOUNTER — Encounter: Payer: Self-pay | Admitting: Physician Assistant

## 2017-12-17 ENCOUNTER — Ambulatory Visit: Payer: Self-pay | Admitting: Physician Assistant

## 2017-12-17 VITALS — BP 156/93 | HR 67 | Temp 98.1°F | Ht 70.0 in | Wt 148.0 lb

## 2017-12-17 DIAGNOSIS — Z9119 Patient's noncompliance with other medical treatment and regimen: Secondary | ICD-10-CM

## 2017-12-17 DIAGNOSIS — I10 Essential (primary) hypertension: Secondary | ICD-10-CM

## 2017-12-17 DIAGNOSIS — Z91199 Patient's noncompliance with other medical treatment and regimen due to unspecified reason: Secondary | ICD-10-CM

## 2017-12-17 MED ORDER — LISINOPRIL 20 MG PO TABS: 20.0000 mg | ORAL_TABLET | Freq: Two times a day (BID) | ORAL | 1 refills | Status: DC

## 2017-12-17 NOTE — Progress Notes (Signed)
BP (!) 156/93 (BP Location: Right Arm, Patient Position: Sitting, Cuff Size: Normal)   Pulse 67   Temp 98.1 F (36.7 C) (Other (Comment))   Ht  (1.778 m)   Wt 148 lb (67.1 kg)   SpO2 100%   BMI 21.24 kg/m    Subjective:    Patient ID: Melvin Hernandez, male    DOB: 02-22-1971, 47 y.o.   MRN: [id number]  HPI: Melvin Hernandez is a 47 y.o. male presenting on 12/17/2017 for Hypertension   HPI   Pt has been out of his lisinopril x 4 day  Pt did not get his labs drawn- he was supposed to do it the next day after his OV 09/04/17  Pt has no complaints today.   Relevant past medical, surgical, family and social history reviewed and updated as indicated. Interim medical history since our last visit reviewed. Allergies and medications reviewed and updated.   Current Outpatient Medications:  .  chlorthalidone (HYGROTON) 25 MG tablet, Take 0.5 tablets (12.5 mg total) by mouth daily., Disp: 30 tablet, Rfl: 0 .  nitroGLYCERIN (NITROSTAT) 0.4 MG SL tablet, Place 1 tablet (0.4 mg total) under the tongue every 5 (five) minutes as needed for chest pain., Disp: 25 tablet, Rfl: 3 .  lisinopril (PRINIVIL,ZESTRIL) 20 MG tablet, Take 1 tablet (20 mg total) by mouth 2 (two) times daily. (Patient not taking: Reported on 12/17/2017), Disp: 60 tablet, Rfl: 0   Review of Systems  Constitutional: Positive for appetite change. Negative for chills, diaphoresis, fatigue, fever and unexpected weight change.  HENT: Positive for dental problem. Negative for congestion, drooling, ear pain, facial swelling, hearing loss, mouth sores, sneezing, sore throat, trouble swallowing and voice change.   Eyes: Negative for pain, discharge, redness, itching and visual disturbance.  Respiratory: Negative for cough, choking, shortness of breath and wheezing.   Cardiovascular: Negative for chest pain, palpitations and leg swelling.  Gastrointestinal: Negative for abdominal pain, blood in stool, constipation, diarrhea and  vomiting.  Endocrine: Negative for cold intolerance, heat intolerance and polydipsia.  Genitourinary: Negative for decreased urine volume, dysuria and hematuria.  Musculoskeletal: Negative for arthralgias, back pain and gait problem.  Skin: Negative for rash.  Allergic/Immunologic: Negative for environmental allergies.  Neurological: Negative for seizures, syncope, light-headedness and headaches.  Hematological: Negative for adenopathy.  Psychiatric/Behavioral: Negative for agitation, dysphoric mood and suicidal ideas. The patient is not nervous/anxious.     Per HPI unless specifically indicated above     Objective:    BP (!) 156/93 (BP Location: Right Arm, Patient Position: Sitting, Cuff Size: Normal)   Pulse 67   Temp 98.1 F (36.7 C) (Other (Comment))   Ht  (1.778 m)   Wt 148 lb (67.1 kg)   SpO2 100%   BMI 21.24 kg/m   Wt Readings from Last 3 Encounters:  12/17/17 148 lb (67.1 kg)  09/04/17 158 lb (71.7 kg)  07/24/17 160 lb (72.6 kg)    Physical Exam  Constitutional: He is oriented to person, place, and time. He appears well-developed and well-nourished.  HENT:  Head: Normocephalic and atraumatic.  Neck: Neck supple.  Cardiovascular: Normal rate and regular rhythm.  Pulmonary/Chest: Effort normal and breath sounds normal. He has no wheezes.  Abdominal: Soft. Bowel sounds are normal. There is no hepatosplenomegaly. There is no tenderness.  Musculoskeletal: He exhibits no edema.  Lymphadenopathy:    He has no cervical adenopathy.  Neurological: He is alert and oriented to person, place, and time.  Skin: Skin is warm and dry.  Psychiatric: He has a normal mood and affect. His behavior is normal.  Vitals reviewed.       Assessment & Plan:    Encounter Diagnoses  Name Primary?  . Essential hypertension, benign Yes  . Personal history of noncompliance with medical treatment, presenting hazards to health     -pt counseled to avoid running out of bp  medications -pt counseled to get fasting labs drawn tomorrow morning -pt to follow up 1 months to recheck blood pressure.  RTO sooner prn

## 2018-01-14 ENCOUNTER — Ambulatory Visit: Payer: Self-pay | Admitting: Physician Assistant

## 2018-01-19 ENCOUNTER — Ambulatory Visit: Payer: Self-pay | Admitting: Physician Assistant

## 2018-02-02 ENCOUNTER — Ambulatory Visit: Payer: Self-pay | Admitting: Physician Assistant

## 2018-02-11 ENCOUNTER — Ambulatory Visit: Payer: Self-pay | Admitting: Physician Assistant

## 2018-02-16 ENCOUNTER — Encounter: Payer: Self-pay | Admitting: Physician Assistant

## 2018-02-19 ENCOUNTER — Ambulatory Visit: Payer: Self-pay | Admitting: Physician Assistant

## 2018-02-19 ENCOUNTER — Encounter: Payer: Self-pay | Admitting: Physician Assistant

## 2018-02-19 VITALS — BP 149/89 | HR 63 | Temp 98.1°F | Ht 70.0 in | Wt 151.2 lb

## 2018-02-19 DIAGNOSIS — Z91199 Patient's noncompliance with other medical treatment and regimen due to unspecified reason: Secondary | ICD-10-CM

## 2018-02-19 DIAGNOSIS — F17290 Nicotine dependence, other tobacco product, uncomplicated: Secondary | ICD-10-CM

## 2018-02-19 DIAGNOSIS — Z13220 Encounter for screening for lipoid disorders: Secondary | ICD-10-CM

## 2018-02-19 DIAGNOSIS — I10 Essential (primary) hypertension: Secondary | ICD-10-CM

## 2018-02-19 DIAGNOSIS — Z9119 Patient's noncompliance with other medical treatment and regimen: Secondary | ICD-10-CM

## 2018-02-19 MED ORDER — LISINOPRIL 20 MG PO TABS: 20.0000 mg | ORAL_TABLET | Freq: Two times a day (BID) | ORAL | 1 refills | Status: DC

## 2018-02-19 MED ORDER — CHLORTHALIDONE 25 MG PO TABS: 12.5000 mg | ORAL_TABLET | Freq: Every day | ORAL | 1 refills | Status: DC

## 2018-02-19 NOTE — Progress Notes (Signed)
BP (!) 149/89 (BP Location: Right Arm, Patient Position: Sitting, Cuff Size: Normal)   Pulse 63   Temp 98.1 F (36.7 C)   Ht 5\' 10"  (1.778 m)   Wt 151 lb 4 oz (68.6 kg)   SpO2 99%   BMI 21.70 kg/m    Subjective:    Patient ID: Melvin Hernandez, male    DOB: 07-11-1971, 47 y.o.   MRN: [id number]  HPI: [name] is a 47 y.o. male presenting on 02/19/2018 for Follow-up   HPI  Labs were ordered to be done after pt's January OV and he didn't get them done.  He was reminded to get them done when he was here in May and he has still not gotten them drawn.  Pt is working some for [employer]  He is working 3rd shift  His chlorthaidone was filled in January and he still has 5 tabs left.  He admits that he isn't taking it every day   Relevant past medical, surgical, family and social history reviewed and updated as indicated. Interim medical history since our last visit reviewed. Allergies and medications reviewed and updated.   Current Outpatient Medications:  .  chlorthalidone (HYGROTON) 25 MG tablet, Take 0.5 tablets (12.5 mg total) by mouth daily., Disp: 30 tablet, Rfl: 0 .  lisinopril (PRINIVIL,ZESTRIL) 20 MG tablet, Take 1 tablet (20 mg total) by mouth 2 (two) times daily., Disp: 60 tablet, Rfl: 1 .  nitroGLYCERIN (NITROSTAT) 0.4 MG SL tablet, Place 1 tablet (0.4 mg total) under the tongue every 5 (five) minutes as needed for chest pain., Disp: 25 tablet, Rfl: 3   Review of Systems  Constitutional: Negative for appetite change, chills, diaphoresis, fatigue, fever and unexpected weight change.  HENT: Negative for congestion, dental problem, drooling, ear pain, facial swelling, hearing loss, mouth sores, sneezing, sore throat, trouble swallowing and voice change.   Eyes: Negative for pain, discharge, redness, itching and visual disturbance.  Respiratory: Negative for cough, choking, shortness of breath and wheezing.   Cardiovascular: Negative for chest pain, palpitations and  leg swelling.  Gastrointestinal: Negative for abdominal pain, blood in stool, constipation, diarrhea and vomiting.  Endocrine: Negative for cold intolerance, heat intolerance and polydipsia.  Genitourinary: Negative for decreased urine volume, dysuria and hematuria.  Musculoskeletal: Negative for arthralgias, back pain and gait problem.  Skin: Negative for rash.  Allergic/Immunologic: Negative for environmental allergies.  Neurological: Negative for seizures, syncope, light-headedness and headaches.  Hematological: Negative for adenopathy.  Psychiatric/Behavioral: Negative for agitation, dysphoric mood and suicidal ideas. The patient is not nervous/anxious.     Per HPI unless specifically indicated above     Objective:    BP (!) 149/89 (BP Location: Right Arm, Patient Position: Sitting, Cuff Size: Normal)   Pulse 63   Temp 98.1 F (36.7 C)   Ht 5\' 10"  (1.778 m)   Wt 151 lb 4 oz (68.6 kg)   SpO2 99%   BMI 21.70 kg/m   Wt Readings from Last 3 Encounters:  02/19/18 151 lb 4 oz (68.6 kg)  12/17/17 148 lb (67.1 kg)  09/04/17 158 lb (71.7 kg)    Physical Exam  Constitutional: He is oriented to person, place, and time. He appears well-developed and well-nourished.  HENT:  Head: Normocephalic and atraumatic.  Neck: Neck supple.  Cardiovascular: Normal rate and regular rhythm.  Pulmonary/Chest: Effort normal and breath sounds normal. He has no wheezes.  Abdominal: Soft. Bowel sounds are normal. There is no hepatosplenomegaly. There is no  tenderness.  Musculoskeletal: He exhibits no edema.  Lymphadenopathy:    He has no cervical adenopathy.  Neurological: He is alert and oriented to person, place, and time.  Skin: Skin is warm and dry.  Psychiatric: He has a normal mood and affect. His behavior is normal.  Vitals reviewed.       Assessment & Plan:   Encounter Diagnoses  Name Primary?  . Essential hypertension, benign Yes  . Screening cholesterol level   . Cigar smoker    . Personal history of noncompliance with medical treatment, presenting hazards to health      -discussed changing chlorthalidone to a more affordable rx but he says he wants to stay on it.  Discussed importance of taking it every day and he agrees -discussed importance of monitoring his labwork and encouraged him to have it drawn.  He says he will -pt is given [facility] application to address lab bills -pt to follow up 6 weeks to recheck bp.  RTO sooner prn

## 2018-04-02 ENCOUNTER — Ambulatory Visit: Payer: Self-pay | Admitting: Physician Assistant

## 2018-04-08 ENCOUNTER — Ambulatory Visit: Payer: Self-pay | Admitting: Physician Assistant

## 2018-04-13 ENCOUNTER — Ambulatory Visit: Payer: Self-pay | Admitting: Physician Assistant

## 2018-04-21 ENCOUNTER — Ambulatory Visit: Payer: Self-pay | Admitting: Physician Assistant

## 2018-04-21 ENCOUNTER — Other Ambulatory Visit (HOSPITAL_COMMUNITY)
Admission: RE | Admit: 2018-04-21 | Discharge: 2018-04-21 | Disposition: A | Discharge status: Home / Self Care | Payer: Self-pay | Source: Ambulatory Visit | Attending: Physician Assistant | Admitting: Physician Assistant

## 2018-04-21 ENCOUNTER — Encounter: Payer: Self-pay | Admitting: Physician Assistant

## 2018-04-21 VITALS — BP 136/80 | HR 61 | Temp 98.1°F | Ht 70.0 in | Wt 149.5 lb

## 2018-04-21 DIAGNOSIS — I10 Essential (primary) hypertension: Secondary | ICD-10-CM | POA: Insufficient documentation

## 2018-04-21 DIAGNOSIS — Z13220 Encounter for screening for lipoid disorders: Secondary | ICD-10-CM | POA: Insufficient documentation

## 2018-04-21 DIAGNOSIS — F17200 Nicotine dependence, unspecified, uncomplicated: Secondary | ICD-10-CM

## 2018-04-21 LAB — COMPREHENSIVE METABOLIC PANEL
ALK PHOS: 64 U/L (ref 38–126)
ALT: 15 U/L (ref 0–44)
ANION GAP: 8 (ref 5–15)
AST: 18 U/L (ref 15–41)
Albumin: 4.4 g/dL (ref 3.5–5.0)
BILIRUBIN TOTAL: 0.8 mg/dL (ref 0.3–1.2)
BUN: 16 mg/dL (ref 6–20)
CALCIUM: 9.6 mg/dL (ref 8.9–10.3)
CO2: 31 mmol/L (ref 22–32)
Chloride: 101 mmol/L (ref 98–111)
Creatinine, Ser: 1.15 mg/dL (ref 0.61–1.24)
Glucose, Bld: 106 mg/dL — ABNORMAL HIGH (ref 70–99)
Potassium: 4.3 mmol/L (ref 3.5–5.1)
Sodium: 140 mmol/L (ref 135–145)
TOTAL PROTEIN: 7.4 g/dL (ref 6.5–8.1)

## 2018-04-21 LAB — LIPID PANEL
CHOL/HDL RATIO: 2 ratio
CHOLESTEROL: 139 mg/dL (ref 0–200)
HDL: 68 mg/dL (ref >40)
LDL Cholesterol: 62 mg/dL (ref 0–99)
Triglycerides: 44 mg/dL (ref <150)
VLDL: 9 mg/dL (ref 0–40)

## 2018-04-21 NOTE — Progress Notes (Signed)
BP 136/80   Pulse 61   Temp 98.1 F (36.7 C)   Ht 5\' 10"  (1.778 m)   Wt 149 lb 8 oz (67.8 kg)   SpO2 99%   BMI 21.45 kg/m    Subjective:    Patient ID: Melvin Hernandez, male    DOB: August 01, 1971, 47 y.o.   MRN: [id number]  HPI: Melvin Hernandez is a 47 y.o. male presenting on 04/21/2018 for Follow-up   HPI   He is tired- he works 3rd shift.  He Doesn't feel nervous or anxious.  No HA or CP.  Relevant past medical, surgical, family and social history reviewed and updated as indicated. Interim medical history since our last visit reviewed. Allergies and medications reviewed and updated.   Current Outpatient Medications:  .  chlorthalidone (HYGROTON) 25 MG tablet, Take 0.5 tablets (12.5 mg total) by mouth daily., Disp: 30 tablet, Rfl: 1 .  lisinopril (PRINIVIL,ZESTRIL) 20 MG tablet, Take 1 tablet (20 mg total) by mouth 2 (two) times daily., Disp: 60 tablet, Rfl: 1 .  nitroGLYCERIN (NITROSTAT) 0.4 MG SL tablet, Place 1 tablet (0.4 mg total) under the tongue every 5 (five) minutes as needed for chest pain., Disp: 25 tablet, Rfl: 3   Review of Systems  Constitutional: Negative for appetite change, chills, diaphoresis, fatigue, fever and unexpected weight change.  HENT: Negative for congestion, dental problem, drooling, ear pain, facial swelling, hearing loss, mouth sores, sneezing, sore throat, trouble swallowing and voice change.   Eyes: Negative for pain, discharge, redness, itching and visual disturbance.  Respiratory: Negative for cough, choking, shortness of breath and wheezing.   Cardiovascular: Negative for chest pain, palpitations and leg swelling.  Gastrointestinal: Negative for abdominal pain, blood in stool, constipation, diarrhea and vomiting.  Endocrine: Negative for cold intolerance, heat intolerance and polydipsia.  Genitourinary: Negative for decreased urine volume, dysuria and hematuria.  Musculoskeletal: Negative for arthralgias, back pain and gait problem.  Skin:  Negative for rash.  Allergic/Immunologic: Negative for environmental allergies.  Neurological: Negative for seizures, syncope, light-headedness and headaches.  Hematological: Negative for adenopathy.  Psychiatric/Behavioral: Negative for agitation, dysphoric mood and suicidal ideas. The patient is not nervous/anxious.     Per HPI unless specifically indicated above     Objective:    BP 136/80   Pulse 61   Temp 98.1 F (36.7 C)   Ht 5\' 10"  (1.778 m)   Wt 149 lb 8 oz (67.8 kg)   SpO2 99%   BMI 21.45 kg/m   Wt Readings from Last 3 Encounters:  04/21/18 149 lb 8 oz (67.8 kg)  02/19/18 151 lb 4 oz (68.6 kg)  12/17/17 148 lb (67.1 kg)    Physical Exam  Constitutional: He is oriented to person, place, and time. He appears well-developed and well-nourished.  HENT:  Head: Normocephalic and atraumatic.  Neck: Neck supple.  Cardiovascular: Normal rate and regular rhythm.  Pulmonary/Chest: Effort normal and breath sounds normal. He has no wheezes.  Abdominal: Soft. Bowel sounds are normal. There is no hepatosplenomegaly. There is no tenderness.  Musculoskeletal: He exhibits no edema.  Lymphadenopathy:    He has no cervical adenopathy.  Neurological: He is alert and oriented to person, place, and time.  Skin: Skin is warm and dry.  Psychiatric: He has a normal mood and affect. His behavior is normal.  Vitals reviewed.   Results for orders placed or performed during the hospital encounter of 04/21/18  Lipid panel  Result Value Ref Range   Cholesterol  139 0 - 200 mg/dL   Triglycerides 44 <161<150 mg/dL   HDL 68 >09>40 mg/dL   Total CHOL/HDL Ratio 2.0 RATIO   VLDL 9 0 - 40 mg/dL   LDL Cholesterol 62 0 - 99 mg/dL  Comprehensive metabolic panel  Result Value Ref Range   Sodium 140 135 - 145 mmol/L   Potassium 4.3 3.5 - 5.1 mmol/L   Chloride 101 98 - 111 mmol/L   CO2 31 22 - 32 mmol/L   Glucose, Bld 106 (H) 70 - 99 mg/dL   BUN 16 6 - 20 mg/dL   Creatinine, Ser 6.041.15 0.61 - 1.24 mg/dL    Calcium 9.6 8.9 - 54.010.3 mg/dL   Total Protein 7.4 6.5 - 8.1 g/dL   Albumin 4.4 3.5 - 5.0 g/dL   AST 18 15 - 41 U/L   ALT 15 0 - 44 U/L   Alkaline Phosphatase 64 38 - 126 U/L   Total Bilirubin 0.8 0.3 - 1.2 mg/dL   GFR calc non Af Amer >60 >60 mL/min   GFR calc Af Amer >60 >60 mL/min   Anion gap 8 5 - 15      Assessment & Plan:   Encounter Diagnoses  Name Primary?  . Essential hypertension, benign Yes  . Tobacco use disorder     -Reviewed labs with pt -pt to Continue current Rx -pt to follow up 2 months.  RTO sooner prn

## 2018-06-17 ENCOUNTER — Ambulatory Visit: Payer: Self-pay | Admitting: Physician Assistant

## 2018-06-24 ENCOUNTER — Encounter: Payer: Self-pay | Admitting: Physician Assistant

## 2018-06-24 ENCOUNTER — Ambulatory Visit: Payer: Self-pay | Admitting: Physician Assistant

## 2018-06-24 VITALS — BP 113/69 | HR 70 | Temp 97.9°F | Ht 70.0 in | Wt 149.2 lb

## 2018-06-24 DIAGNOSIS — I10 Essential (primary) hypertension: Secondary | ICD-10-CM

## 2018-06-24 DIAGNOSIS — F17200 Nicotine dependence, unspecified, uncomplicated: Secondary | ICD-10-CM

## 2018-06-24 MED ORDER — LISINOPRIL 20 MG PO TABS: 20.0000 mg | ORAL_TABLET | Freq: Two times a day (BID) | ORAL | 4 refills | Status: DC

## 2018-06-24 MED ORDER — CHLORTHALIDONE 25 MG PO TABS: 12.5000 mg | ORAL_TABLET | Freq: Every day | ORAL | 4 refills | Status: DC

## 2018-06-24 NOTE — Progress Notes (Signed)
BP 113/69 (BP Location: Left Arm, Patient Position: Sitting, Cuff Size: Normal)   Pulse 70   Temp 97.9 F (36.6 C)   Ht 5\' 10"  (1.778 m)   Wt 149 lb 4 oz (67.7 kg)   SpO2 99%   BMI 21.42 kg/m    Subjective:    Patient ID: Melvin Hernandez, male    DOB: 07/20/1971, 47 y.o.   MRN: [id number]  HPI: Melvin CantorSteven L Nienhaus is a 47 y.o. male presenting on 06/24/2018 for Hypertension   HPI   Pt is doing well.  He is taking his medication as prescribed.   Relevant past medical, surgical, family and social history reviewed and updated as indicated. Interim medical history since our last visit reviewed. Allergies and medications reviewed and updated.   Current Outpatient Medications:  .  chlorthalidone (HYGROTON) 25 MG tablet, Take 0.5 tablets (12.5 mg total) by mouth daily., Disp: 30 tablet, Rfl: 1 .  lisinopril (PRINIVIL,ZESTRIL) 20 MG tablet, Take 1 tablet (20 mg total) by mouth 2 (two) times daily., Disp: 60 tablet, Rfl: 1 .  nitroGLYCERIN (NITROSTAT) 0.4 MG SL tablet, Place 1 tablet (0.4 mg total) under the tongue every 5 (five) minutes as needed for chest pain., Disp: 25 tablet, Rfl: 3  Review of Systems  Constitutional: Negative for appetite change, chills, diaphoresis, fatigue, fever and unexpected weight change.  HENT: Negative for congestion, dental problem, drooling, ear pain, facial swelling, hearing loss, mouth sores, sneezing, sore throat, trouble swallowing and voice change.   Eyes: Negative for pain, discharge, redness, itching and visual disturbance.  Respiratory: Negative for cough, choking, shortness of breath and wheezing.   Cardiovascular: Negative for chest pain, palpitations and leg swelling.  Gastrointestinal: Negative for abdominal pain, blood in stool, constipation, diarrhea and vomiting.  Endocrine: Negative for cold intolerance, heat intolerance and polydipsia.  Genitourinary: Negative for decreased urine volume, dysuria and hematuria.  Musculoskeletal: Negative for  arthralgias, back pain and gait problem.  Skin: Negative for rash.  Allergic/Immunologic: Negative for environmental allergies.  Neurological: Negative for seizures, syncope, light-headedness and headaches.  Hematological: Negative for adenopathy.  Psychiatric/Behavioral: Negative for agitation, dysphoric mood and suicidal ideas. The patient is not nervous/anxious.     Per HPI unless specifically indicated above     Objective:    BP 113/69 (BP Location: Left Arm, Patient Position: Sitting, Cuff Size: Normal)   Pulse 70   Temp 97.9 F (36.6 C)   Ht 5\' 10"  (1.778 m)   Wt 149 lb 4 oz (67.7 kg)   SpO2 99%   BMI 21.42 kg/m   Wt Readings from Last 3 Encounters:  06/24/18 149 lb 4 oz (67.7 kg)  04/21/18 149 lb 8 oz (67.8 kg)  02/19/18 151 lb 4 oz (68.6 kg)    Physical Exam  Constitutional: He is oriented to person, place, and time. He appears well-developed and well-nourished.  HENT:  Head: Normocephalic and atraumatic.  Neck: Neck supple.  Cardiovascular: Normal rate and regular rhythm.  Pulmonary/Chest: Effort normal and breath sounds normal. He has no wheezes.  Abdominal: Soft. Bowel sounds are normal. There is no hepatosplenomegaly. There is no tenderness.  Musculoskeletal: He exhibits no edema.  Lymphadenopathy:    He has no cervical adenopathy.  Neurological: He is alert and oriented to person, place, and time.  Skin: Skin is warm and dry.  Psychiatric: He has a normal mood and affect. His behavior is normal.  Vitals reviewed.       Assessment & Plan:  Encounter Diagnoses  Name Primary?  . Essential hypertension, benign Yes  . Tobacco use disorder     -will Check bmp -pt to Continue current medications -Counseled smoking cessation -Pt to follow up in 3 months.  RTO sooner prn

## 2018-09-23 ENCOUNTER — Encounter: Payer: Self-pay | Admitting: Physician Assistant

## 2018-09-23 ENCOUNTER — Ambulatory Visit: Payer: Self-pay | Admitting: Physician Assistant

## 2018-09-23 VITALS — BP 130/85 | HR 61 | Temp 97.9°F | Ht 70.0 in | Wt 151.8 lb

## 2018-09-23 DIAGNOSIS — F17200 Nicotine dependence, unspecified, uncomplicated: Secondary | ICD-10-CM

## 2018-09-23 DIAGNOSIS — I10 Essential (primary) hypertension: Secondary | ICD-10-CM

## 2018-09-23 MED ORDER — LISINOPRIL 20 MG PO TABS: 20.0000 mg | ORAL_TABLET | Freq: Two times a day (BID) | ORAL | 4 refills | Status: DC

## 2018-09-23 MED ORDER — CHLORTHALIDONE 25 MG PO TABS: 12.5000 mg | ORAL_TABLET | Freq: Every day | ORAL | 4 refills | Status: DC

## 2018-09-23 NOTE — Progress Notes (Signed)
BP 130/85 (BP Location: Right Arm, Patient Position: Sitting, Cuff Size: Normal)   Pulse 61   Temp 97.9 F (36.6 C) (Other (Comment))   Ht 5\' 10"  (1.778 m)   Wt 151 lb 12 oz (68.8 kg)   SpO2 98%   BMI 21.77 kg/m    Subjective:    Patient ID: Melvin Hernandez, male    DOB: Dec 01, 1970, 48 y.o.   MRN: [id number]  HPI: Melvin Hernandez is a 48 y.o. male presenting on 09/23/2018 for Hypertension   HPI   Pt is feeling good and has no complaints today.  He is still smoking cigars sometimes.   Relevant past medical, surgical, family and social history reviewed and updated as indicated. Interim medical history since our last visit reviewed. Allergies and medications reviewed and updated.   Current Outpatient Medications:  .  chlorthalidone (HYGROTON) 25 MG tablet, Take 0.5 tablets (12.5 mg total) by mouth daily., Disp: 30 tablet, Rfl: 4 .  lisinopril (PRINIVIL,ZESTRIL) 20 MG tablet, Take 1 tablet (20 mg total) by mouth 2 (two) times daily., Disp: 60 tablet, Rfl: 4 .  nitroGLYCERIN (NITROSTAT) 0.4 MG SL tablet, Place 1 tablet (0.4 mg total) under the tongue every 5 (five) minutes as needed for chest pain., Disp: 25 tablet, Rfl: 3    Review of Systems  Constitutional: Negative for appetite change, chills, diaphoresis, fatigue, fever and unexpected weight change.  HENT: Negative for congestion, dental problem, drooling, ear pain, facial swelling, hearing loss, mouth sores, sneezing, sore throat, trouble swallowing and voice change.   Eyes: Negative for pain, discharge, redness, itching and visual disturbance.  Respiratory: Negative for cough, choking, shortness of breath and wheezing.   Cardiovascular: Negative for chest pain, palpitations and leg swelling.  Gastrointestinal: Negative for abdominal pain, blood in stool, constipation, diarrhea and vomiting.  Endocrine: Negative for cold intolerance, heat intolerance and polydipsia.  Genitourinary: Negative for decreased urine volume, dysuria  and hematuria.  Musculoskeletal: Negative for arthralgias, back pain and gait problem.  Skin: Negative for rash.  Allergic/Immunologic: Negative for environmental allergies.  Neurological: Negative for seizures, syncope, light-headedness and headaches.  Hematological: Negative for adenopathy.  Psychiatric/Behavioral: Negative for agitation, dysphoric mood and suicidal ideas. The patient is not nervous/anxious.     Per HPI unless specifically indicated above     Objective:    BP 130/85 (BP Location: Right Arm, Patient Position: Sitting, Cuff Size: Normal)   Pulse 61   Temp 97.9 F (36.6 C) (Other (Comment))   Ht 5\' 10"  (1.778 m)   Wt 151 lb 12 oz (68.8 kg)   SpO2 98%   BMI 21.77 kg/m   Wt Readings from Last 3 Encounters:  09/23/18 151 lb 12 oz (68.8 kg)  06/24/18 149 lb 4 oz (67.7 kg)  04/21/18 149 lb 8 oz (67.8 kg)    Physical Exam Vitals signs reviewed.  Constitutional:      Appearance: He is well-developed.  HENT:     Head: Normocephalic and atraumatic.  Neck:     Musculoskeletal: Neck supple.  Cardiovascular:     Rate and Rhythm: Normal rate and regular rhythm.  Pulmonary:     Effort: Pulmonary effort is normal.     Breath sounds: Normal breath sounds. No wheezing.  Abdominal:     General: Bowel sounds are normal.     Palpations: Abdomen is soft.     Tenderness: There is no abdominal tenderness.  Lymphadenopathy:     Cervical: No cervical adenopathy.  Skin:  General: Skin is warm and dry.  Neurological:     Mental Status: He is alert and oriented to person, place, and time.  Psychiatric:        Behavior: Behavior normal.         Assessment & Plan:   Encounter Diagnoses  Name Primary?  . Essential hypertension, benign Yes  . Tobacco use disorder    -will check Bmp today and call pt with results -counseled smoking cessation -pt to continue current medications -pt to follow up 53months.  RTO sooner prn

## 2018-12-22 ENCOUNTER — Ambulatory Visit: Payer: Self-pay | Admitting: Physician Assistant

## 2019-01-26 ENCOUNTER — Ambulatory Visit: Payer: Self-pay | Admitting: Physician Assistant

## 2019-02-04 ENCOUNTER — Ambulatory Visit: Payer: Self-pay | Admitting: Physician Assistant

## 2019-02-04 ENCOUNTER — Encounter: Payer: Self-pay | Admitting: Physician Assistant

## 2019-02-04 DIAGNOSIS — I10 Essential (primary) hypertension: Secondary | ICD-10-CM

## 2019-02-04 DIAGNOSIS — F17200 Nicotine dependence, unspecified, uncomplicated: Secondary | ICD-10-CM

## 2019-02-04 MED ORDER — CHLORTHALIDONE 25 MG PO TABS: 12.5000 mg | ORAL_TABLET | Freq: Every day | ORAL | 4 refills | Status: DC

## 2019-02-04 MED ORDER — LISINOPRIL 20 MG PO TABS: 20.0000 mg | ORAL_TABLET | Freq: Two times a day (BID) | ORAL | 4 refills | Status: DC

## 2019-02-04 NOTE — Progress Notes (Signed)
   There were no vitals taken for this visit.   Subjective:    Patient ID: Melvin Hernandez, male    DOB: 1971-05-01, 48 y.o.   MRN: [id number]  HPI: Melvin Hernandez is a 48 y.o. male presenting on 02/04/2019 for No chief complaint on file.   HPI   This is a telemedicine visit due to coronavirus pandemic.  It is via telephone as pt was unable to get connected with video through [employer]  I connected with  Melvin Hernandez on 02/04/19 by a video enabled telemedicine application and verified that I am speaking with the correct person using two identifiers.   I discussed the limitations of evaluation and management by telemedicine. The patient expressed understanding and agreed to proceed.   Pt is at home.  Provider is at office  Pt is doing well and has no complaints.  He says that he wears a mask when he goes out but that mostly he is trying to stay at home.        Relevant past medical, surgical, family and social history reviewed and updated as indicated. Interim medical history since our last visit reviewed. Allergies and medications reviewed and updated.   Current Outpatient Medications:  .  chlorthalidone (HYGROTON) 25 MG tablet, Take 0.5 tablets (12.5 mg total) by mouth daily., Disp: 30 tablet, Rfl: 4 .  lisinopril (PRINIVIL,ZESTRIL) 20 MG tablet, Take 1 tablet (20 mg total) by mouth 2 (two) times daily., Disp: 60 tablet, Rfl: 4 .  nitroGLYCERIN (NITROSTAT) 0.4 MG SL tablet, Place 1 tablet (0.4 mg total) under the tongue every 5 (five) minutes as needed for chest pain., Disp: 25 tablet, Rfl: 3   Review of Systems  Per HPI unless specifically indicated above     Objective:    There were no vitals taken for this visit.  Wt Readings from Last 3 Encounters:  09/23/18 151 lb 12 oz (68.8 kg)  06/24/18 149 lb 4 oz (67.7 kg)  04/21/18 149 lb 8 oz (67.8 kg)    Physical Exam Pulmonary:     Effort: No respiratory distress.  Neurological:     Mental Status: He is alert and oriented  to person, place, and time.  Psychiatric:        Attention and Perception: Attention normal.        Speech: Speech normal.        Behavior: Behavior is cooperative.             Assessment & Plan:    Encounter Diagnoses  Name Primary?  . Essential hypertension, benign Yes  . Tobacco use disorder     -pt to Get labs drawn.  He will be called with results -will Continue current medications -pt encouraged to Wear mask when in public per CDC guidelines -encouraged smoking cessation -pt to follow up in 3 months.  He is to contact office sooner prn

## 2019-04-29 ENCOUNTER — Other Ambulatory Visit: Payer: Self-pay | Admitting: Physician Assistant

## 2019-04-29 DIAGNOSIS — I10 Essential (primary) hypertension: Secondary | ICD-10-CM

## 2019-05-10 ENCOUNTER — Other Ambulatory Visit (HOSPITAL_COMMUNITY)
Admission: RE | Admit: 2019-05-10 | Discharge: 2019-05-10 | Disposition: A | Discharge status: Home / Self Care | Payer: Self-pay | Source: Ambulatory Visit | Attending: Physician Assistant | Admitting: Physician Assistant

## 2019-05-10 DIAGNOSIS — I10 Essential (primary) hypertension: Secondary | ICD-10-CM | POA: Insufficient documentation

## 2019-05-10 LAB — BASIC METABOLIC PANEL
Anion gap: 10 (ref 5–15)
BUN: 18 mg/dL (ref 6–20)
CO2: 21 mmol/L — ABNORMAL LOW (ref 22–32)
Calcium: 9.3 mg/dL (ref 8.9–10.3)
Chloride: 107 mmol/L (ref 98–111)
Creatinine, Ser: 1.21 mg/dL (ref 0.61–1.24)
GFR calc Af Amer: >60 mL/min (ref >60)
GFR calc non Af Amer: >60 mL/min (ref >60)
Glucose, Bld: 112 mg/dL — ABNORMAL HIGH (ref 70–99)
Potassium: 3.9 mmol/L (ref 3.5–5.1)
Sodium: 138 mmol/L (ref 135–145)

## 2019-05-12 ENCOUNTER — Encounter: Payer: Self-pay | Admitting: Physician Assistant

## 2019-05-12 ENCOUNTER — Ambulatory Visit: Payer: Self-pay | Admitting: Physician Assistant

## 2019-05-12 ENCOUNTER — Other Ambulatory Visit: Payer: Self-pay

## 2019-05-12 VITALS — BP 174/99 | HR 64 | Temp 98.4°F | Wt 154.0 lb

## 2019-05-12 DIAGNOSIS — I10 Essential (primary) hypertension: Secondary | ICD-10-CM

## 2019-05-12 DIAGNOSIS — F17290 Nicotine dependence, other tobacco product, uncomplicated: Secondary | ICD-10-CM

## 2019-05-12 MED ORDER — CHLORTHALIDONE 25 MG PO TABS: 12.5000 mg | ORAL_TABLET | Freq: Every day | ORAL | 4 refills | Status: DC

## 2019-05-12 MED ORDER — AMLODIPINE BESYLATE 5 MG PO TABS: 5.0000 mg | ORAL_TABLET | Freq: Every day | ORAL | 3 refills | Status: DC

## 2019-05-12 MED ORDER — LISINOPRIL 20 MG PO TABS: 20.0000 mg | ORAL_TABLET | Freq: Two times a day (BID) | ORAL | 4 refills | Status: DC

## 2019-05-12 NOTE — Progress Notes (Signed)
BP (!) 174/99   Pulse 64   Temp 98.4 F (36.9 C)   Wt 154 lb (69.9 kg)   SpO2 96%   BMI 22.10 kg/m    Subjective:    Patient ID: Melvin Hernandez, male    DOB: 26-Apr-1971, 48 y.o.   MRN: [id number]  HPI: Melvin Hernandez is a 48 y.o. male presenting on 05/12/2019 for No chief complaint on file.   HPI  Pt had a negatvie covid 19 screening questionnaire   Pt has been checking bp at home and it is running high, "like it is today".  He says he is feeling fine.  He has no complaints.      Relevant past medical, surgical, family and social history reviewed and updated as indicated. Interim medical history since our last visit reviewed. Allergies and medications reviewed and updated.  Current Outpatient Medications:  .  chlorthalidone (HYGROTON) 25 MG tablet, Take 0.5 tablets (12.5 mg total) by mouth daily., Disp: 30 tablet, Rfl: 4 .  lisinopril (ZESTRIL) 20 MG tablet, Take 1 tablet (20 mg total) by mouth 2 (two) times daily., Disp: 60 tablet, Rfl: 4 .  nitroGLYCERIN (NITROSTAT) 0.4 MG SL tablet, Place 1 tablet (0.4 mg total) under the tongue every 5 (five) minutes as needed for chest pain. (Patient not taking: Reported on 05/12/2019), Disp: 25 tablet, Rfl: 3     Review of Systems  Per HPI unless specifically indicated above     Objective:    BP (!) 174/99   Pulse 64   Temp 98.4 F (36.9 C)   Wt 154 lb (69.9 kg)   SpO2 96%   BMI 22.10 kg/m   Wt Readings from Last 3 Encounters:  05/12/19 154 lb (69.9 kg)  09/23/18 151 lb 12 oz (68.8 kg)  06/24/18 149 lb 4 oz (67.7 kg)    Physical Exam Vitals signs reviewed.  Constitutional:      General: He is not in acute distress.    Appearance: Normal appearance. He is well-developed and normal weight. He is not ill-appearing.  HENT:     Head: Normocephalic and atraumatic.  Neck:     Musculoskeletal: Neck supple.  Cardiovascular:     Rate and Rhythm: Normal rate and regular rhythm.  Pulmonary:     Effort: Pulmonary effort is  normal.     Breath sounds: Normal breath sounds. No wheezing.  Abdominal:     General: Bowel sounds are normal.     Palpations: Abdomen is soft.     Tenderness: There is no abdominal tenderness.  Musculoskeletal:     Right lower leg: No edema.     Left lower leg: No edema.  Lymphadenopathy:     Cervical: No cervical adenopathy.  Skin:    General: Skin is warm and dry.  Neurological:     Mental Status: He is alert and oriented to person, place, and time.  Psychiatric:        Attention and Perception: Attention normal.        Mood and Affect: Mood normal.        Speech: Speech normal.        Behavior: Behavior normal. Behavior is cooperative.     Results for orders placed or performed during the hospital encounter of 05/10/19  Basic metabolic panel  Result Value Ref Range   Sodium 138 135 - 145 mmol/L   Potassium 3.9 3.5 - 5.1 mmol/L   Chloride 107 98 - 111 mmol/L   CO2  21 (L) 22 - 32 mmol/L   Glucose, Bld 112 (H) 70 - 99 mg/dL   BUN 18 6 - 20 mg/dL   Creatinine, Ser 1.21 0.61 - 1.24 mg/dL   Calcium 9.3 8.9 - 10.3 mg/dL   GFR calc non Af Amer >60 >60 mL/min   GFR calc Af Amer >60 >60 mL/min   Anion gap 10 5 - 15      Assessment & Plan:   Encounter Diagnoses  Name Primary?  . Essential hypertension, benign Yes  . Cigar smoker       -reviewed labs with pt -Add amlodipine -pt to monitor bp at home -follow up appointment 1 month to recheck bp.  He is to contact office sooner prn

## 2019-06-09 ENCOUNTER — Ambulatory Visit: Payer: Self-pay | Admitting: Physician Assistant

## 2019-06-09 ENCOUNTER — Encounter: Payer: Self-pay | Admitting: Physician Assistant

## 2019-06-09 VITALS — BP 148/92

## 2019-06-09 DIAGNOSIS — R509 Fever, unspecified: Secondary | ICD-10-CM

## 2019-06-09 DIAGNOSIS — R0981 Nasal congestion: Secondary | ICD-10-CM

## 2019-06-09 DIAGNOSIS — I10 Essential (primary) hypertension: Secondary | ICD-10-CM

## 2019-06-09 DIAGNOSIS — Z20822 Contact with and (suspected) exposure to covid-19: Secondary | ICD-10-CM

## 2019-06-09 DIAGNOSIS — F17200 Nicotine dependence, unspecified, uncomplicated: Secondary | ICD-10-CM

## 2019-06-09 MED ORDER — AMLODIPINE BESYLATE 10 MG PO TABS: 10.0000 mg | ORAL_TABLET | Freq: Every day | ORAL | 3 refills | Status: DC

## 2019-06-09 NOTE — Progress Notes (Signed)
BP (!) 148/92    Subjective:    Patient ID: Melvin Hernandez, male    DOB: Apr 08, 1971, 48 y.o.   MRN: [id number]  HPI: Melvin Hernandez is a 48 y.o. male presenting on 06/09/2019 for Hypertension   HPI    This is a telemedicine appointment due to coronavirus pandemic.  It is via Telephone as pt did not want to try to connect through [employer] for video appointment this morning  I connected with  Gerrie Nordmann on 06/09/19 by a video enabled telemedicine application and verified that I am speaking with the correct person using two identifiers.   I discussed the limitations of evaluation and management by telemedicine. The patient expressed understanding and agreed to proceed.  Pt is at home. Provider is at office.    Pt says he got a fever started last night.  He also reports congestion.  He denies SOB.  He says He is wearing a mask when he goes out.  He is currently not employed.     Relevant past medical, surgical, family and social history reviewed and updated as indicated. Interim medical history since our last visit reviewed. Allergies and medications reviewed and updated.   Current Outpatient Medications:  .  amLODipine (NORVASC) 5 MG tablet, Take 1 tablet (5 mg total) by mouth daily., Disp: 30 tablet, Rfl: 3 .  chlorthalidone (HYGROTON) 25 MG tablet, Take 0.5 tablets (12.5 mg total) by mouth daily., Disp: 30 tablet, Rfl: 4 .  lisinopril (ZESTRIL) 20 MG tablet, Take 1 tablet (20 mg total) by mouth 2 (two) times daily., Disp: 60 tablet, Rfl: 4 .  nitroGLYCERIN (NITROSTAT) 0.4 MG SL tablet, Place 1 tablet (0.4 mg total) under the tongue every 5 (five) minutes as needed for chest pain. (Patient not taking: Reported on 05/12/2019), Disp: 25 tablet, Rfl: 3     Review of Systems  Per HPI unless specifically indicated above     Objective:    BP (!) 148/92   Wt Readings from Last 3 Encounters:  05/12/19 154 lb (69.9 kg)  09/23/18 151 lb 12 oz (68.8 kg)  06/24/18 149 lb 4 oz (67.7  kg)    Physical Exam Pulmonary:     Effort: No respiratory distress.  Neurological:     Mental Status: He is alert and oriented to person, place, and time.  Psychiatric:        Attention and Perception: Attention normal.        Speech: Speech normal.        Behavior: Behavior is cooperative.         Assessment & Plan:    1. Hypertension  Will increase amlodipine to 10 mg.  Pt is to continue the lisiopril and chlorthalidone.  Pt is to continue to monitor his blood pressure at home.   Pt will follow up with telemedicine visit in 1 month to recheck the blood pressure.    2. Subjective fever, congestion  Discussed with pt that he needs a covid test.  Pt is very resistant to this and says he knows he doesn't have it.  Discussed that no one can know that they don't have it without being tested and in light of his symptoms, he really should be tested.  Discussed that he wouldn't want to spread the virus if he has it.  Pt is still not wanting to get tested.  Pt is told that a test will be ordered for him and he should go to the testing  site at [facility] today.  Pt is told that he should self-isolate until he gets his test results.   Pt to contact office if needed prior to his follow up appoitntment.

## 2019-07-13 ENCOUNTER — Ambulatory Visit: Payer: Self-pay | Admitting: Physician Assistant

## 2019-07-21 ENCOUNTER — Encounter: Payer: Self-pay | Admitting: Physician Assistant

## 2019-07-21 ENCOUNTER — Ambulatory Visit: Payer: Self-pay | Admitting: Physician Assistant

## 2019-07-21 VITALS — BP 120/69

## 2019-07-21 DIAGNOSIS — Z131 Encounter for screening for diabetes mellitus: Secondary | ICD-10-CM

## 2019-07-21 DIAGNOSIS — I10 Essential (primary) hypertension: Secondary | ICD-10-CM

## 2019-07-21 NOTE — Progress Notes (Signed)
   BP 120/69    Subjective:    Patient ID: Melvin Hernandez, male    DOB: March 26, 1971, 48 y.o.   MRN: [id number]  HPI: [name] is a 48 y.o. male presenting on 07/21/2019 for No chief complaint on file.   HPI   This is a telemedicine appointment due to coronavirus pandemic.  It is via Telephone as pt could not get connected to video through [employer].    I connected with  Melvin Hernandez on 07/21/19 by a video enabled telemedicine application and verified that I am speaking with the correct person using two identifiers.   I discussed the limitations of evaluation and management by telemedicine. The patient expressed understanding and agreed to proceed.  Pt is at home.   Provider is working from home office.    Pt appointment today is to f/u htn.  He checks his BP at home.  His reading today is listed above.  He says it has been doing good.  Pt did not get covid test after November appointment as recommended  Pt says he is feeling well today and he has no complaints    Relevant past medical, surgical, family and social history reviewed and updated as indicated. Interim medical history since our last visit reviewed. Allergies and medications reviewed and updated.   Current Outpatient Medications:  .  amLODipine (NORVASC) 10 MG tablet, Take 1 tablet (10 mg total) by mouth daily., Disp: 30 tablet, Rfl: 3 .  chlorthalidone (HYGROTON) 25 MG tablet, Take 0.5 tablets (12.5 mg total) by mouth daily., Disp: 30 tablet, Rfl: 4 .  lisinopril (ZESTRIL) 20 MG tablet, Take 1 tablet (20 mg total) by mouth 2 (two) times daily., Disp: 60 tablet, Rfl: 4 .  nitroGLYCERIN (NITROSTAT) 0.4 MG SL tablet, Place 1 tablet (0.4 mg total) under the tongue every 5 (five) minutes as needed for chest pain. (Patient not taking: Reported on 05/12/2019), Disp: 25 tablet, Rfl: 3   Review of Systems  Per HPI unless specifically indicated above     Objective:    BP 120/69   Wt Readings from Last 3 Encounters:   05/12/19 154 lb (69.9 kg)  09/23/18 151 lb 12 oz (68.8 kg)  06/24/18 149 lb 4 oz (67.7 kg)    Physical Exam Pulmonary:     Effort: No respiratory distress.  Neurological:     Mental Status: He is alert and oriented to person, place, and time.  Psychiatric:        Attention and Perception: Attention normal.        Speech: Speech normal.        Behavior: Behavior is cooperative.          Assessment & Plan:    Encounter Diagnosis  Name Primary?  . Essential hypertension, benign Yes      Pt HTN is well controlled.  He is to continue current medications.    Pt encouraged to continue to wear a mask when he goes out in public to reduce risk of covid 19 transmission  Pt to follow up in 3 months.  He is to contact office sooner prn

## 2019-10-18 ENCOUNTER — Other Ambulatory Visit: Payer: Self-pay | Admitting: Physician Assistant

## 2019-10-18 MED ORDER — CHLORTHALIDONE 25 MG PO TABS: 12.5000 mg | ORAL_TABLET | Freq: Every day | ORAL | 0 refills | Status: AC

## 2019-10-18 MED ORDER — LISINOPRIL 20 MG PO TABS: 20.0000 mg | ORAL_TABLET | Freq: Two times a day (BID) | ORAL | 0 refills | Status: AC

## 2019-10-18 MED ORDER — AMLODIPINE BESYLATE 10 MG PO TABS: 10.0000 mg | ORAL_TABLET | Freq: Every day | ORAL | 0 refills | Status: AC

## 2019-10-19 ENCOUNTER — Ambulatory Visit: Payer: Self-pay | Admitting: Physician Assistant

## 2020-06-18 ENCOUNTER — Ambulatory Visit
Admission: EM | Admit: 2020-06-18 | Discharge: 2020-06-18 | Disposition: A | Discharge status: Home / Self Care | Payer: Self-pay | Attending: Emergency Medicine | Admitting: Emergency Medicine

## 2020-06-18 ENCOUNTER — Other Ambulatory Visit: Payer: Self-pay

## 2020-06-18 DIAGNOSIS — H01001 Unspecified blepharitis right upper eyelid: Secondary | ICD-10-CM

## 2020-06-18 DIAGNOSIS — R030 Elevated blood-pressure reading, without diagnosis of hypertension: Secondary | ICD-10-CM

## 2020-06-18 DIAGNOSIS — H1031 Unspecified acute conjunctivitis, right eye: Secondary | ICD-10-CM

## 2020-06-18 MED ORDER — ERYTHROMYCIN 5 MG/GM OP OINT: TOPICAL_OINTMENT | OPHTHALMIC | 0 refills | Status: AC

## 2020-06-18 MED ORDER — POLYMYXIN B-TRIMETHOPRIM 10000-0.1 UNIT/ML-% OP SOLN: 1.0000 [drp] | Freq: Four times a day (QID) | OPHTHALMIC | 0 refills | Status: AC

## 2020-06-18 NOTE — Discharge Instructions (Signed)
Conjunctivitis Use eye drops as prescribed and to completion Dispose of old contacts and wear glasses until you have finished course of antibiotic eye drops Wash pillow cases, wash hands regularly with soap and water, avoid touching your face and eyes, wash door handles, light switches, remotes and other objects you frequently touch Return or follow up with PCP if symptoms persists such as fever, chills, redness, swelling, eye pain, painful eye movements, vision changes, etc...  Blepharitis:  Continue warm compresses at home.  Soak a wash cloth in warm (not scalding) water and place it over the eyes. As the wash cloth cools, it should be rewarmed and replaced for a total of 5 to 10 minutes of soaking time. Warm compresses should be applied two to four times a day as long as the patient has symptoms Perform lid washing: Either warm water or very dilute baby shampoo can be placed on a clean wash cloth, gauze pad, or cotton swab. Then be advised to gently clean along the lashes and lid margin to remove the accumulated material with care to avoid contacting the ocular surface. If shampoo is used, thorough rinsing is recommended. Vigorous washing should be avoided, as it may cause more irritation.  Prescribed erythromycin ointment.  Apply up to 6 times daily for 5-7 days, or until symptomatic improvement Follow up with ophthalmology for further evaluation and management if symptoms persists Return or go to ER if you have any new or worsening symptoms such as fever, chills, redness, swelling, eye pain, painful eye movements, vision changes, etc...  Blood pressure elevated in office.  Please recheck in 24 hours.  If it continues to be greater than 140/90 please follow up with PCP for further evaluation and management.

## 2020-06-18 NOTE — ED Provider Notes (Signed)
[facility]   [id number] 06/18/20 Arrival Time: 1258  CC: Red eye  SUBJECTIVE:  Melvin Hernandez is a 49 y.o. male who presents with complaint of RT eye redness, eyelid swelling and redness x 2-3 days.  Denies a precipitating event, trauma, or close contacts with similar symptoms.  Has tried OTC eye drops without relief.  Symptoms are made worse with time.  Denies similar symptoms in the past.  Complains of associated drainage.  Denies fever, chills, nausea, vomiting, eye pain, painful eye movements,  itching, vision changes, double vision, FB sensation, periorbital erythema.     Denies contact lens use.    ROS: As per HPI.  All other pertinent ROS negative.     Past Medical History:  Diagnosis Date  . Hypertension    History reviewed. No pertinent surgical history. No Known Allergies No current facility-administered medications on file prior to encounter.   Current Outpatient Medications on File Prior to Encounter  Medication Sig Dispense Refill  . amLODipine (NORVASC) 10 MG tablet Take 1 tablet (10 mg total) by mouth daily. 30 tablet 0  . chlorthalidone (HYGROTON) 25 MG tablet Take 0.5 tablets (12.5 mg total) by mouth daily. 30 tablet 0  . lisinopril (ZESTRIL) 20 MG tablet Take 1 tablet (20 mg total) by mouth 2 (two) times daily. 60 tablet 0  . nitroGLYCERIN (NITROSTAT) 0.4 MG SL tablet Place 1 tablet (0.4 mg total) under the tongue every 5 (five) minutes as needed for chest pain. (Patient not taking: Reported on 05/12/2019) 25 tablet 3   Social History   Socioeconomic History  . Marital status: Single    Spouse name: Not on file  . Number of children: Not on file  . Years of education: Not on file  . Highest education level: Not on file  Occupational History  . Not on file  Tobacco Use  . Smoking status: Current Every Day Smoker    Packs/day: 2.00    Years: 27.00    Pack years: 54.00    Types: Cigars    Start date: 04/19/1990  . Smokeless tobacco: Never Used    . Tobacco comment: hx cigarettes x 27 yr, smokes 1 cigar a day  Substance and Sexual Activity  . Alcohol use: Yes    Comment: occassionally  . Drug use: No  . Sexual activity: Not on file  Other Topics Concern  . Not on file  Social History Narrative  . Not on file   Social Determinants of Health   Financial Resource Strain:   . Difficulty of Paying Living Expenses: Not on file  Food Insecurity:   . Worried About Programme researcher, broadcasting/film/video in the Last Year: Not on file  . Ran Out of Food in the Last Year: Not on file  Transportation Needs:   . Lack of Transportation (Medical): Not on file  . Lack of Transportation (Non-Medical): Not on file  Physical Activity:   . Days of Exercise per Week: Not on file  . Minutes of Exercise per Session: Not on file  Stress:   . Feeling of Stress : Not on file  Social Connections:   . Frequency of Communication with Friends and Family: Not on file  . Frequency of Social Gatherings with Friends and Family: Not on file  . Attends Religious Services: Not on file  . Active Member of Clubs or Organizations: Not on file  . Attends Banker Meetings: Not on file  . Marital Status: Not on file  Intimate Partner Violence:   . Fear of Current or Ex-Partner: Not on file  . Emotionally Abused: Not on file  . Physically Abused: Not on file  . Sexually Abused: Not on file   Family History  Problem Relation Age of Onset  . Hypertension Mother     OBJECTIVE:    Visual Acuity  Right Eye Distance:   Left Eye Distance:   Bilateral Distance:    Right Eye Near:   Left Eye Near:    Bilateral Near:      Vitals:   06/18/20 1346  BP: (!) 211/116  Pulse: 68  Resp: 17  Temp: 98.2 F (36.8 C)  TempSrc: Oral  SpO2: 97%    General appearance: alert; no distress HENT: NCAT; nares patent, oropharynx clear Eyes: RT upper eyelid with swelling and erythema, no obvious stye formation, midly TTP; mild to moderate conjunctival erythema. PERRL;  EOMI without discomfort; clear obvious drainage Neck: supple Lungs: clear to auscultation bilaterally Heart: regular rate and rhythm Skin: warm and dry Psychological: alert and cooperative; normal mood and affect  ASSESSMENT & PLAN:  1. Acute bacterial conjunctivitis of right eye   2. Blepharitis of right upper eyelid, unspecified type   3. Elevated blood pressure reading     Meds ordered this encounter  Medications  . erythromycin ophthalmic ointment    Sig: Place a 1/2 inch ribbon of ointment into the lower eyelid.    Dispense:  3.5 g    Refill:  0    Order Specific Question:   Supervising Provider    Answer:   Eustace Moore [[id number]]  . trimethoprim-polymyxin b (POLYTRIM) ophthalmic solution    Sig: Place 1 drop into the right eye 4 (four) times daily for 7 days.    Dispense:  10 mL    Refill:  0    Order Specific Question:   Supervising Provider    Answer:   Eustace Moore [[id number]]     Conjunctivitis Use eye drops as prescribed and to completion Dispose of old contacts and wear glasses until you have finished course of antibiotic eye drops Wash pillow cases, wash hands regularly with soap and water, avoid touching your face and eyes, wash door handles, light switches, remotes and other objects you frequently touch Return or follow up with PCP if symptoms persists such as fever, chills, redness, swelling, eye pain, painful eye movements, vision changes, etc...  Blepharitis:  Continue warm compresses at home.  Soak a wash cloth in warm (not scalding) water and place it over the eyes. As the wash cloth cools, it should be rewarmed and replaced for a total of 5 to 10 minutes of soaking time. Warm compresses should be applied two to four times a day as long as the patient has symptoms Perform lid washing: Either warm water or very dilute baby shampoo can be placed on a clean wash cloth, gauze pad, or cotton swab. Then be advised to gently clean along the lashes and lid  margin to remove the accumulated material with care to avoid contacting the ocular surface. If shampoo is used, thorough rinsing is recommended. Vigorous washing should be avoided, as it may cause more irritation.  Prescribed erythromycin ointment.  Apply up to 6 times daily for 5-7 days, or until symptomatic improvement Follow up with ophthalmology for further evaluation and management if symptoms persists Return or go to ER if you have any new or worsening symptoms such as fever, chills, redness, swelling, eye pain, painful  eye movements, vision changes, etc...  Blood pressure elevated in office.  Please recheck in 24 hours.  If it continues to be greater than 140/90 please follow up with PCP for further evaluation and management.    Reviewed expectations re: course of current medical issues. Questions answered. Outlined signs and symptoms indicating need for more acute intervention. Patient verbalized understanding. After Visit Summary given.   Rennis Harding, PA-C 06/18/20 1429

## 2020-06-18 NOTE — ED Triage Notes (Signed)
Pt presents with right eye irritation form stye, not improved with overcounter medications

## 2024-01-06 NOTE — Progress Notes (Unsigned)
 Pt was seen on 01/06/2024 at event screening. BP was 115/81. Pt did indicate he does have a PCP. Pt noted he is a smoker. No SDOH needs indicated.

## 2024-02-13 ENCOUNTER — Ambulatory Visit: Payer: Self-pay | Admitting: Surgery

## 2024-02-13 NOTE — H&P (Signed)
 Hyden Soley [id number]   Referring Provider:  Chet Marca Lev*   Subjective   Chief Complaint: New Consultation     History of Present Illness:    Very pleasant 53 year old man with history of hypertension, tobacco abuse (67.6 pack years), no previous abdominal surgery who presents for consultation regarding chronic cholecystitis.  His ultrasound report was send but there are no apparent labs or other documentation sent.  For the last several months he has been having postprandial right upper quadrant pain, most notable at night and most intense after fried or greasy foods.  No associated nausea.  The pain is more of an aggravating sensation associated with bloating.  Bowel movements have been regular, he is working on scheduling a screening colonoscopy.  He had an ultrasound at [facility] ultrasound which notes gallstones, sludge, gallbladder wall thickening up to 6 mm, no pericholecystic fluid or Murphy sign; common bile duct 4 mm.  Liver normal.   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  Review of Systems: A complete review of systems was obtained from the patient.  I have reviewed this information and discussed as appropriate with the patient.  See HPI as well for other ROS.   Medical History: History reviewed. No pertinent past medical history.  There is no problem list on file for this patient.   History reviewed. No pertinent surgical history.   No Known Allergies  Current Outpatient Medications on File Prior to Visit  Medication Sig Dispense Refill   chlorthalidone  25 MG tablet Take 12.5 mg by mouth once daily     lisinopriL  (ZESTRIL ) 10 MG tablet Take 10 mg by mouth every morning     tamsulosin (FLOMAX) 0.4 mg capsule Take 0.4 mg by mouth daily     No current facility-administered medications on file prior to visit.    Family History  Problem Relation Age of Onset   Skin  cancer Mother    High blood pressure (Hypertension) Mother      Social History   Tobacco Use  Smoking Status Every Day   Current packs/day: 1.00   Types: Cigarettes  Smokeless Tobacco Never     Social History   Socioeconomic History   Marital status: Single  Tobacco Use   Smoking status: Every Day    Current packs/day: 1.00    Types: Cigarettes   Smokeless tobacco: Never  Substance and Sexual Activity   Alcohol use: Yes    Alcohol/week: 2.0 - 6.0 standard drinks of alcohol    Types: 2 - 6 Standard drinks or equivalent per week   Drug use: Never   Social Drivers of Health   Food Insecurity: No Food Insecurity (01/06/2024)   Received from [facility]   Hunger Vital Sign    Within the past 12 months, you worried that your food would run out before you got the money to buy more.: Never true    Within the past 12 months, the food you bought just didn't last and you didn't have money to get more.: Never true  Transportation Needs: No Transportation Needs (01/06/2024)   Received from [facility]    Lack of Transportation (Medical): No    Lack of Transportation (Non-Medical): No  Housing Stability: Unknown (02/13/2024)   Housing Stability Vital Sign    Homeless in the Last Year: No    Objective:    Vitals:  02/13/24 1430 02/13/24 1431  BP: 130/80   Temp: 36.8 C (98.3 F)   Weight: 71.6 kg (157 lb 12.8 oz)   Height: 177.8 cm (5' 10)   PainSc:  0-No pain  PainLoc:  Abdomen    Body mass index is 22.64 kg/m.  Gen: A&Ox3, no distress  Chest: respiratory effort is normal. Abdomen: soft, nondistended, nontender.  Neuro: no gross deficit Psych: appropriate mood and affect, normal insight/judgment intact  Skin: warm and dry    Assessment and Plan:  Diagnoses and all orders for this visit:  Chronic cholecystitis    I recommend proceeding with laparoscopic cholecystectomy with possible cholangiogram.  Discussed the relevant anatomy  using a diagram to demonstrate, and went over surgical technique.  Discussed risks of surgery including bleeding, infection, pain, scarring, intraabdominal injury specifically to the common bile duct and sequelae, subtotal cholecystectomy, bile leak, conversion to open surgery, failure to resolve symptoms, post-cholecystectomy diarrhea which is typically self-limited, blood clots/ pulmonary embolus, heart attack, pneumonia, stroke, etc. Questions were welcomed and answered to patient's satisfaction.  Patient wishes to proceed with surgery.    Mitzie Freund MD FACS

## 2024-02-20 ENCOUNTER — Encounter (HOSPITAL_COMMUNITY): Admission: RE | Admit: 2024-02-20 | Payer: Self-pay | Source: Ambulatory Visit

## 2024-02-24 ENCOUNTER — Ambulatory Visit (HOSPITAL_COMMUNITY): Admission: RE | Admit: 2024-02-24 | Payer: Self-pay | Source: Home / Self Care | Admitting: Surgery

## 2024-02-24 ENCOUNTER — Encounter (HOSPITAL_COMMUNITY): Admission: RE | Payer: Self-pay | Source: Home / Self Care

## 2024-02-24 SURGERY — LAPAROSCOPIC CHOLECYSTECTOMY
Anesthesia: General

## 2024-05-26 NOTE — Progress Notes (Signed)
 Pt attended 01/06/24 screening event where his bp was 115/81. Pt documented that he does have a PCP and noted that he does have insurance.Pt noted that he is a smoker and did not note any SDOH needs at the time of the event.  Chart review indicates he does have a PCP listed as Espinoza, Alejandra DO. Pt documented having [facility] for his insurance. Pt was referred by his PCP to see Connor,Chelsea MD with general surgery. Letter sent to patient with smoking cessation education. No additional follow up to be scheduled per HE protocol.
